# Patient Record
Sex: Male | Born: 1970 | Race: White | Hispanic: No | Marital: Married | Smoking: Never smoker
Health system: Southern US, Community
[De-identification: ages and names within clinical notes are randomized; demographics above are authoritative.]

## PROBLEM LIST (undated history)

## (undated) DIAGNOSIS — T7840XA Allergy, unspecified, initial encounter: Secondary | ICD-10-CM

## (undated) DIAGNOSIS — N201 Calculus of ureter: Secondary | ICD-10-CM

## (undated) DIAGNOSIS — E119 Type 2 diabetes mellitus without complications: Secondary | ICD-10-CM

## (undated) DIAGNOSIS — K5790 Diverticulosis of intestine, part unspecified, without perforation or abscess without bleeding: Secondary | ICD-10-CM

## (undated) DIAGNOSIS — Z9289 Personal history of other medical treatment: Secondary | ICD-10-CM

## (undated) DIAGNOSIS — I2699 Other pulmonary embolism without acute cor pulmonale: Secondary | ICD-10-CM

## (undated) DIAGNOSIS — E785 Hyperlipidemia, unspecified: Secondary | ICD-10-CM

## (undated) DIAGNOSIS — F419 Anxiety disorder, unspecified: Secondary | ICD-10-CM

## (undated) DIAGNOSIS — M1990 Unspecified osteoarthritis, unspecified site: Secondary | ICD-10-CM

## (undated) DIAGNOSIS — I82409 Acute embolism and thrombosis of unspecified deep veins of unspecified lower extremity: Secondary | ICD-10-CM

## (undated) DIAGNOSIS — M109 Gout, unspecified: Secondary | ICD-10-CM

## (undated) DIAGNOSIS — K760 Fatty (change of) liver, not elsewhere classified: Secondary | ICD-10-CM

## (undated) DIAGNOSIS — F32A Depression, unspecified: Secondary | ICD-10-CM

## (undated) DIAGNOSIS — G4733 Obstructive sleep apnea (adult) (pediatric): Secondary | ICD-10-CM

## (undated) DIAGNOSIS — M069 Rheumatoid arthritis, unspecified: Secondary | ICD-10-CM

## (undated) DIAGNOSIS — J45909 Unspecified asthma, uncomplicated: Secondary | ICD-10-CM

## (undated) DIAGNOSIS — F329 Major depressive disorder, single episode, unspecified: Secondary | ICD-10-CM

## (undated) DIAGNOSIS — I10 Essential (primary) hypertension: Secondary | ICD-10-CM

## (undated) HISTORY — DX: Major depressive disorder, single episode, unspecified: F32.9

## (undated) HISTORY — DX: Allergy, unspecified, initial encounter: T78.40XA

## (undated) HISTORY — DX: Other pulmonary embolism without acute cor pulmonale: I26.99

## (undated) HISTORY — DX: Diverticulosis of intestine, part unspecified, without perforation or abscess without bleeding: K57.90

## (undated) HISTORY — DX: Anxiety disorder, unspecified: F41.9

## (undated) HISTORY — DX: Acute embolism and thrombosis of unspecified deep veins of unspecified lower extremity: I82.409

## (undated) HISTORY — PX: KNEE SURGERY: SHX244

## (undated) HISTORY — DX: Hyperlipidemia, unspecified: E78.5

## (undated) HISTORY — DX: Essential (primary) hypertension: I10

## (undated) HISTORY — DX: Depression, unspecified: F32.A

## (undated) HISTORY — DX: Fatty (change of) liver, not elsewhere classified: K76.0

## (undated) HISTORY — DX: Obstructive sleep apnea (adult) (pediatric): G47.33

## (undated) HISTORY — DX: Type 2 diabetes mellitus without complications: E11.9

## (undated) HISTORY — DX: Personal history of other medical treatment: Z92.89

## (undated) HISTORY — PX: KNEE ARTHROSCOPY: SUR90

## (undated) HISTORY — DX: Unspecified osteoarthritis, unspecified site: M19.90

## (undated) HISTORY — DX: Unspecified asthma, uncomplicated: J45.909

## (undated) HISTORY — DX: Calculus of ureter: N20.1

## (undated) HISTORY — DX: Rheumatoid arthritis, unspecified: M06.9

## (undated) HISTORY — DX: Gout, unspecified: M10.9

---

## 2001-04-08 ENCOUNTER — Encounter: Payer: Self-pay | Admitting: Emergency Medicine

## 2001-04-08 ENCOUNTER — Emergency Department (HOSPITAL_COMMUNITY): Admission: EM | Admit: 2001-04-08 | Discharge: 2001-04-08 | Payer: Self-pay | Admitting: Emergency Medicine

## 2001-04-14 ENCOUNTER — Encounter: Payer: Self-pay | Admitting: Orthopedic Surgery

## 2001-04-14 ENCOUNTER — Encounter: Admission: RE | Admit: 2001-04-14 | Discharge: 2001-04-14 | Payer: Self-pay | Admitting: Orthopedic Surgery

## 2001-05-05 ENCOUNTER — Inpatient Hospital Stay (HOSPITAL_COMMUNITY): Admission: EM | Admit: 2001-05-05 | Discharge: 2001-05-10 | Payer: Self-pay | Admitting: Emergency Medicine

## 2001-05-05 ENCOUNTER — Encounter: Payer: Self-pay | Admitting: Orthopedic Surgery

## 2001-05-09 ENCOUNTER — Encounter: Payer: Self-pay | Admitting: Orthopedic Surgery

## 2001-05-28 ENCOUNTER — Inpatient Hospital Stay (HOSPITAL_COMMUNITY): Admission: RE | Admit: 2001-05-28 | Discharge: 2001-06-15 | Payer: Self-pay | Admitting: Orthopedic Surgery

## 2001-05-28 ENCOUNTER — Encounter: Payer: Self-pay | Admitting: Orthopedic Surgery

## 2001-05-29 ENCOUNTER — Encounter: Payer: Self-pay | Admitting: Orthopedic Surgery

## 2001-05-31 ENCOUNTER — Encounter: Payer: Self-pay | Admitting: Orthopedic Surgery

## 2001-06-03 ENCOUNTER — Encounter: Payer: Self-pay | Admitting: Orthopedic Surgery

## 2001-06-04 ENCOUNTER — Encounter: Payer: Self-pay | Admitting: Orthopedic Surgery

## 2001-06-07 ENCOUNTER — Encounter: Payer: Self-pay | Admitting: Orthopedic Surgery

## 2001-07-20 ENCOUNTER — Encounter: Admission: RE | Admit: 2001-07-20 | Discharge: 2001-07-20 | Payer: Self-pay | Admitting: Internal Medicine

## 2003-01-12 ENCOUNTER — Emergency Department (HOSPITAL_COMMUNITY): Admission: EM | Admit: 2003-01-12 | Discharge: 2003-01-12 | Payer: Self-pay | Admitting: Emergency Medicine

## 2003-01-12 ENCOUNTER — Encounter: Payer: Self-pay | Admitting: Emergency Medicine

## 2003-03-29 ENCOUNTER — Ambulatory Visit (HOSPITAL_COMMUNITY): Admission: RE | Admit: 2003-03-29 | Discharge: 2003-03-29 | Payer: Self-pay | Admitting: Specialist

## 2005-03-20 ENCOUNTER — Encounter: Admission: RE | Admit: 2005-03-20 | Discharge: 2005-03-20 | Payer: Self-pay | Admitting: Specialist

## 2005-06-20 ENCOUNTER — Ambulatory Visit (HOSPITAL_BASED_OUTPATIENT_CLINIC_OR_DEPARTMENT_OTHER): Admission: RE | Admit: 2005-06-20 | Discharge: 2005-06-20 | Payer: Self-pay | Admitting: Specialist

## 2005-06-20 ENCOUNTER — Ambulatory Visit (HOSPITAL_COMMUNITY): Admission: RE | Admit: 2005-06-20 | Discharge: 2005-06-20 | Payer: Self-pay | Admitting: Specialist

## 2008-06-26 ENCOUNTER — Encounter
Admission: RE | Admit: 2008-06-26 | Discharge: 2008-06-27 | Payer: Self-pay | Admitting: Physical Medicine & Rehabilitation

## 2008-06-27 ENCOUNTER — Ambulatory Visit: Payer: Self-pay | Admitting: Physical Medicine & Rehabilitation

## 2008-07-30 ENCOUNTER — Encounter: Admission: RE | Admit: 2008-07-30 | Discharge: 2008-07-30 | Payer: Self-pay | Admitting: Orthopaedic Surgery

## 2011-03-11 NOTE — Group Therapy Note (Signed)
CHIEF COMPLAINT:  Knee pain greater than back pain.   HISTORY:  Nathan Krause is a 40 year old male who had arthroscopic surgery of  the left knee in July 2002.  He had postoperative complications  including septic arthritis.  This resulted in additional procedures and  IV antibiotics.  He developed a DVT as well as a PE, was on Coumadin for  a time for the treatment of this.  Later underwent debridement of  fibrosis, a chondroplasty of the patellar femoral joint by another  surgeon on the left knee as well.  Subsequent to this, on June 20, 2005, he underwent a right knee medial meniscal tear debridement as well  as ACL debridement, but was felt to have a stable ACL and 75% of it  remained intact.  The patient has had cortisone injections which helped  him for 1-2 days, Synvisc injections that were not helpful.  He has been  recently fitted with a knee arthrosis.  He has had physical therapy 3-4  times postoperatively, but the last time was about 3 years ago.  He  still continues with some Achilles and hamstring stretching exercise.   His current pain is 3/10, average pain is 6, described as dull,  constant, increased with activity to 7/10 levels.  Sleep is poor,  partially related to the pain, partially related to what sounds like  worrying about the future.  Pain is worse with walking, sitting, and  standing.  Twisting does lead to some hip pain in his case.  His pain  improves with rest, heat, TENS unit on the knee and medications.  Relief  with meds is fair.  He walks 5 to 10 minutes at a time.  He climbs a few  steps, he drives, transverse alone.  He was last employed in January  2008.  He indicates problems with both prolonged sitting and prolonged  standing.  He completed his Oswestry disability questionnaire, had a  score of 48.  His standing tolerance is reported as 10 minutes.  His  sitting tolerance is 1 hour.  He needs assistance with meal prep,  household duties and shopping.  I  think he also indicates some problems  with dressing and this is mainly his shoes and socks.  He does not use  any adaptive equipment.  He has numbness and tingling, trouble walking,  spasms, depression, and anxiety.  He has had some weight gain, coughing,  and shortness of breath.   MEDICATIONS:  1. Simvastatin 40 mg a day.  2. Hydrochlorothiazide 25 mg a day.  3. Aspirin 81 mg a day.  4. Multivitamin 1 tablet per day.  5. Cinnamon 1 tablet per day.  6. Flaxseed oil 1300 mg per day.  7. Celexa 10 mg daily.  8. Albuterol inhaler p.r.n.   His pain medications include;  1. Ultram 50 mg b.i.d.  2. Mobic 7.5 t.i.d.  3. Vicodin 7.5 t.i.d.  4. Flexeril 10 mg t.i.d.  5. Glucosamine was started once a day.   In regards to the Vicodin, he thinks it helps his pain somewhat, but he  does not like to drive on this medicines because it makes him feel  funny.   SOCIAL HISTORY:  He is married.  He is applied for disability.  He last  worked in January 2008.  He tried going from a light duty type job to a  sedentary duty type job, but states that his knee pain increased with  prolonged sitting as well as prolonged  standing.   REVIEW OF SYSTEMS:  No bowel or bladder dysfunction.  No fevers.   VITAL SIGNS:  His blood pressure is 126/87, pulse 94, respirations 18,  and O2 sat 96% on room air.  GENERAL:  He is a well-developed, well-nourished male in no acute  distress.  Orientation x3.  Affect is bright and alert.  Gait is normal.  EXTREMITIES:  Without edema.  He has some multiple surgical scar, left  knee.  Deep tendon reflexes are normal.  Sensation is normal.  His upper  extremity strength is 5/5 in the deltoid, biceps, and grip.  Lower  extremity strength is 5/5 in the hip flexor, right knee extensor, right  knee flexor, right ankle dorsiflexor, and on the left side he has 4/5  strength in knee extensor, knee flexor, and 5 in the ankle dorsiflexor.  His muscle bulk appears equal  bilaterally.  He has decreased hip flexion  on the left side compared to the right side as well.  Internal and  external rotation is only minimally diminished.  There is mild  tenderness to palpation of greater trochanters.  His knees showed no  evidence of effusion.  He does have some medial joint line tenderness  compared to the lateral on the left side, but not on the right side.  His spine range of motion is 75% forward flexion and only 50% extension,  he feels like extension would cause him to fall if he got to go further.  His muscle tone in the upper and lower extremities is normal.  The range  of motion in the upper extremities are normal, lower extremities as  noted above.  Ankle range of motion is normal.   IMPRESSION:  1. Chronic postoperative pain, left knee.  He has knee contracture,      gets about 90 degrees of flexion.  This is also inhibiting      functionally in terms of getting on the shoes or socks on that      side.  2. Right knee pain, cannot really explain it just on the basis of his      meniscal pathology.  He may have some early arthritis as well, but      once again pain seems out of proportion to underlying knee      pathology.  He does have some intermittent right lower extremity      numbness as well as back pain, so I question whether the right knee      pain may be more radicular pain and therefore I am recommending MRI      or CT of the lumbar spine to further evaluate and may need to do      selective nerve root blocks as well.  If this is nonrevealing, may      consider EMG and CV of the lower extremities.  3. Further recommendations include occupational therapy for some      assisted devices such as reacher as well as a sock aid and elastic      shoelaces so that he can get the shoes and socks on without having      assistance.  4. Given his problems with side effects with narcotic analgesics in      terms of driving, we will see if there is other  pain relieving      medications available to him.  He has tried 50 mg b.i.d. of      tramadol, we try to double  up on that to 100 b.i.d. and alternate      with the Vicodin.  He may be all to get by with the tramadol during      the day and the Vicodin more in evening hours when he does not have      to drive.   He can continue the glucosamine, the Flexeril, and the Mobic.  He is  really taking the Mobic on rainy days and days when his knee pain is  flared up which I agree with rather than taking it chronically.   I do not think any other knee injections would be helpful.  TENS units  is certainly helpful.  estim unit within knee brace might be an option  for him.   He already does have a brace order and I think it is already fabricated.  I am not sure whether the bionicare could be applied to this current  brace.   Thank you for this interesting consultation, keep you appraised of the  situation.  We will check urine drug screen, if this shows no signs of  illicit-drug abuse or nondisclosed opiates would be listed in his  narcotic analgesic prescription and once again seek to minimize with  adjuvant treatments.      Erick Colace, M.D.  Electronically Signed     [facility]  D:  06/28/2008 10:00:16  T:  06/29/2008 00:30:27  Job #:  [id number]   cc:   Vanita Panda. Magnus Ivan, M.D.  Fax: [phone]   Dr. Chuck Hint

## 2011-03-14 NOTE — Op Note (Signed)
NAME:  Nathan Krause, Nathan Krause                           ACCOUNT NO.:  [id number]   MEDICAL RECORD NO.:  [id number]                   PATIENT TYPE:  AMB   LOCATION:  DAY                                  FACILITY:  [facility]   PHYSICIAN:  Jene Every, M.D.                 DATE OF BIRTH:  11-24-1970   DATE OF PROCEDURE:  03/29/2003  DATE OF DISCHARGE:                                 OPERATIVE REPORT   PREOPERATIVE DIAGNOSIS:  Degenerative joint disease, left knee,  arthrofibrosis, left knee, anterior cruciate ligament tear, chondromalacia.   POSTOPERATIVE DIAGNOSIS:  Arthrofibrosis of the left knee, flexion  contracture, grade 2 changes of patellofemoral joint, retracted patella,  anterior cruciate ligament tear.   PROCEDURE PERFORMED:  Exam under anesthesia, left knee arthroscopy,  debridement of fibrosis, chondroplasty of the patellofemoral joint.   ANESTHESIA:  General.   BRIEF HISTORY AND INDICATIONS:  This is a 40 year old male who has had an  extensive complex history of left knee.  He is status post patellar  dislocation treated by an outside physician with surgical reconstruction and  repair.  He developed arthrofibrosis, was casted, had DVT, PE, Staph  infection.  He was injured at work on March 18 and was seen by outside  physician.  MRI felt he had possible meniscus tear, he has an ACL tear.  The  patient was injected with cortisone which gave him temporary relief.  Arthroscopic evaluation was suggested in lieu of a complex reconstruction of  the knee.  The patient underwent diagnostic arthroscopy with debridement.  The risks and benefits were discussed including infection, no change in  symptoms, worsening in symptoms, need for total knee arthroplasty in the  future, etc.   SURGICAL TECHNIQUE:  The patient was placed in supine position, after  adequate general anesthesia and 1 gram of Kefzol, we examined the knee.  There was 20 degrees flexion contracture, he can flex to 110  degrees, no  instability to varus or valgus stressing.  No anterior or posterior drawer.  Next, a lateral patellar portal was fashioned with a #11 blade as was a  superomedial and parapatellar portal.  The cannula was atraumatically  placed.  Extensive arthrofibrosis was noted.  The patella was tracking  laterally, however, there was no glide or tilt associated with it.  I broke  up some of the scar tissue and arthrofibrosis with a blunt cannula.  I  placed a superolateral parapatellar portal, as well, introduced a cannula  through there, and broke up arthrofibrosis, as well.  Direct visualization,  a medial parapatellar portal was fashioned with a #11 blade after  localization with an 18 gauge needle.  There was extensive difficulty  in  visualization due to the extensive arthrofibrosis noted throughout the  joint.  The suprapatellar pouch, again, there was no tilt, glide, or  extensive chondromalacia of the patellofemoral joint, lateral tracking of  the  patella.  The ACL tear was felt to be near complete.  There were  extensive changes on the lateral femoral condyle and arthrofibrosis in the  infrapatellar region and the intercondylar notch region.  We introduced the  shaver and utilized it to debride the patellofemoral joint and the femoral  sulcus as well as the condyles.  His stiffness in the knee and his  arthrofibrosis precluded evaluation of the medial and lateral compartments  due to, again, the inability to visualize or insert instruments into the  joint lines given the epidural fibrosis.  I, however, did lavage both  compartments as well as the patellofemoral joint.  We placed a second portal  laterally above the other portals and lysed more adhesions laterally.  On  copious lavage of the knee there was copious portions of chondral material  in the effluent.  The gutters were not examined again due to arthrofibrosis.  It was felt that at this point in time, given the extensive  nature of his  arthrofibrosis, that the arthroscopic procedure at this point in time had  achieved its optimum result.  We lavaged the knee copiously and removed the  fluid and removed the arthroscopic equipment.  We closed the portals with 4-  0 nylon simple sutures.  Again, prior to this, we examined the ACL and it  appeared to be 75% torn, but again there was no increased instability noted  on exam.  We closed the portals with 4-0 nylon simple sutures.  0.25%  Marcaine with epinephrine was infiltrated into the joint.  The wounds were  dressed sterilely.  The patient was awakened without difficulty and  transported to the recovery room in satisfactory condition.  The patient  tolerated the procedure well, there were no complications.                                               Jene Every, M.D.    Cordelia Pen  D:  03/29/2003  T:  03/29/2003  Job:  [id number]

## 2011-03-14 NOTE — Op Note (Signed)
[place]  Patient:    Nathan Krause, Nathan Krause Visit Number: [id number] MRN: [id number]          Service Type: SUR Location: 5000 [id number] Attending Physician:  Teena Dunk Dictated by:   Sharlot Gowda., M.D. Proc. Date: 05/29/01 Admit Date:  05/28/2001 Discharge Date: 06/15/2001                             Operative Report  PREOPERATIVE DIAGNOSIS:  Possible septic arthritis and arthrofibrosis, left knee.  POSTOPERATIVE DIAGNOSIS:  Possible septic arthritis and arthrofibrosis, left knee.  PROCEDURES: 1. Arthroscopic lavage with synovectomy. 2. Manipulation.  SURGEON:  Marcie Mowers, Montez Hageman., M.D.  DESCRIPTION OF PROCEDURE:  The patient was arthroscoped through a superomedial, inferomedial, and inferolateral portal.  Deep cultures were sent.  There was bloody serous fluid that did not appear overtly purulent. There was some reactive synovitis.  The patient had had a previous medial meniscectomy.  This appeared to be in good shape.  The articular cartilage did not show any undue damage.  He was lavaged out with approximately 12,000 cc of fluid.  The knee was noted to be moderately stiff.  It was manipulated to a range of motion of approximately 5-10 to 100 degrees, 90-100 degrees compared to a preoperative range of motion of 20-50.  Hemovac drains were placed deeply, and he was taken to the recovery room in stable condition. Dictated by:   Sharlot Gowda., M.D. Attending Physician:  Teena Dunk DD:  08/13/01 TD:  08/14/01 Job: [id number]

## 2011-03-14 NOTE — Consult Note (Signed)
[place]  Patient:    Nathan Krause, Nathan Krause                        MRN: [id number] Proc. Date: 05/05/01 Adm. Date:  [id number] Attending:  Teena Dunk CC:         Pearla Dubonnet, M.D.  Sharlot Gowda., M.D.   Consultation Report  HISTORY OF PRESENT ILLNESS:  This is a 40 year old white male status post left knee surgery July 5 by Dr. Kristeen Miss.  The procedure was partially open for repair of medial meniscus injury and partial removal of patella.  The patient presents today with 24 hours of left medial thigh pain and the onset today of anterolateral pleuritic chest pain associated with fever of 102 without chills.  He may have had some shortness of breath.  He has had two days of a nonproductive cough.  He was seen by Dr. Madelon Lips today and was felt not to have a postoperative infection.  He was sent to the ER where a CT scan of the left leg shows a left superficial femoral vein DVT.  A CT scan of the chest was questionable for a left lower lobe pulmonary embolus.  There was lingular and left lower lobe atelectasis.  There is no family history of DVT or PE.  PAST MEDICAL HISTORY: 1. Asthma, mild, persistent.  Symptoms more severe in summer and winter and    uses albuterol MDI q.d. currently. 2. Hyperlipidemia.  PAST SURGICAL HISTORY: 1. Right knee arthroscopy. 2. Left knee surgery April 30, 2001 by Dr. Madelon Lips.  ALLERGIES:  LIPITOR caused stomach upset, PERCOCET nausea.  MEDICATIONS:  Albuterol MDI q.i.d. p.r.n.  FAMILY HISTORY:  Father died age 42 of MI.  Mother age 53 in good health.  No siblings.  SOCIAL HISTORY:  Married.  He is a nonsmoker.  Rare alcohol.  He is International aid/development worker at [employer].  REVIEW OF SYSTEMS:  Otherwise negative.  PHYSICAL EXAMINATION  GENERAL:  He appears comfortable and in no distress.  VITAL SIGNS:  Pending.  Oxygen saturation is 92-95% on room air.  HEENT:  Oral cavity and pharynx is  clear.  NECK:  No elevation of jugular venous pressure.  LUNGS:  Mild decreased breath sounds bilaterally without wheezes or rales.  HEART:  Regular rate and rhythm without murmur, gallop, or rub.  ABDOMEN:  Soft, nontender.  Normal bowel sounds.  No mass or hepatosplenomegaly.  EXTREMITIES:  Tenderness to palpation in the left medial thigh, but no calf edema or tenderness.  LABORATORIES:  Sodium 133, potassium 3.7, chloride 104, bicarbonate 26, BUN 11, creatinine 1.1, glucose 137, alkaline phosphatase 131, total bilirubin 1.5.  CBC:  Hemoglobin 15.0, platelet count 337,000, WBC 10.2.  EKG shows sinus tachycardia with a rate of 109 and nonspecific T-wave changes. CT scan of the leg shows a left superficial femoral vein deep venous thrombosis.  CT scan of the chest shows a questionable left lung pulmonary embolus and left lower lobe and left lingular atelectasis.  ASSESSMENT: 1. Left thigh deep venous thrombosis/probable left lung pulmonary embolus in a    setting of a recent left knee surgery. 2. Asthma, mild, persistent.  He does not have an acute exacerbation.  RECOMMENDATION:  IV heparin per pharmacy.  Coumadin per pharmacy.  Albuterol p.r.n.  Oxygen. DD:  05/05/01 TD:  05/06/01 Job: [id number]

## 2011-03-14 NOTE — Op Note (Signed)
[place]  Patient:    Nathan Krause, Nathan Krause Visit Number: [id number] MRN: [id number]          Service Type: SUR Location: 5000 [id number] Attending Physician:  Teena Dunk Dictated by:   Sharlot Gowda., M.D. Proc. Date: 07/21/01 Admit Date:  05/28/2001 Discharge Date: 06/15/2001                             Operative Report  PREOPERATIVE DIAGNOSIS:  Probable pyarthrosis of left knee.  POSTOPERATIVE DIAGNOSIS:  Probable pyarthrosis of left knee.  OPERATION PERFORMED: 1. Arthroscopic synovectomy with 2. Arthroscopic lavage. 3. Manipulation of left knee under general anesthesia.  SURGEON:  Sharlot Gowda., M.D.  ANESTHESIA:  General.  DESCRIPTION OF PROCEDURE:  The patient had one culture drawn in the office which was apparently growing Staph and he had previously had a history of DVT. For these reasons, he was scheduled for arthroscopic lavage.  He was approached through a superomedial, inferomedial and inferolateral portal. Systematic inspection of the knee showed there to be a bloody exudate within the joint.  New cultures were sent from the joint.  There was no significant damage to the articular cartilage.  Previous meniscectomy site looked good. There was moderate reactive synovitis which was debrided followed by copious irrigation with approximately 1200 cc of fluid.  Hemovac drains were placed deeply into the joint and sutured in.  In addition, the knee which was stiff was manipulated to probably 5 to 95 from a preoperative range of motion from 20 to 40 degrees only.  He was placed in lightly compressive sterile dressings and a knee immobilizer and taken to recovery room in stable condition. Dictated by:   Sharlot Gowda., M.D. Attending Physician:  Teena Dunk DD:  07/21/01 TD:  07/21/01 Job: [id number]

## 2011-03-14 NOTE — Discharge Summary (Signed)
[place]  Patient:    Nathan Krause, Nathan Krause                        MRN: [id number] Adm. Date:  [id number] Disc. Date: [id number] Attending:  Teena Dunk Dictator:   Arnoldo Morale, P.A.C. CC:         Pearla Dubonnet, M.D.   Discharge Summary  ADMISSION DIAGNOSES: 1. Asthma. 2. Pulmonary emboli versus pneumonia.  DISCHARGE DIAGNOSES: 1. Asthma. 2. Left thigh deep venous thrombosis. 3. Pulmonary emboli. 4. Constipation.  SURGICAL PROCEDURE:  None.  CONSULTS: 1. Internal medicine consult obtained by Pearla Dubonnet, M.D.,    May 05, 2001. 2. Pharmacy consult for Lovenox, heparin, and Coumadin therapy May 05, 2001. 3. Case management May 06, 2001. 4. Physical therapy consult May 07, 2001.  HISTORY OF PRESENT ILLNESS:   This 40 year old white male patient had undergone a left knee arthroscopy with lateral release by Dr. Madelon Lips on April 30, 2001.  This was done as an outpatient. The patient started to develop severe nausea and vomiting after surgery and then complained of some left lower chest wall discomfort and an elevated temperature of 102.  He was seen in the office and was noted to have a swollen left knee and tender thigh and significant shortness of breath and decreased lung sounds in his bases.  He was admitted to rule out a DVT and pulmonary emboli versus pneumonia.  HOSPITAL COURSE:  Mr. Clock was admitted and seen immediately by Dr. Robley Fries.  CT of the left leg at that time showed a left superficial femoral vein deep venous thrombosis and chest CT at that time showed possible left lower lobe pulmonary emboli and lingular atelectasis. He was started on IV heparin per pharmacy protocol and also Coumadin to anticoagulate him for the clots and albuterol and oxygen to help with his respiratory status. Dr. Kevan Ny followed him closely throughout his hospitalization.  On hospital day #2, May 06, 2001, he reported  his thigh pain was improved. Blood cultures and knee aspirates were still pending for infection.  His Coumadin was not therapeutic at that time.  He was continued on IV heparin. He was still basically on bed rest at that time. T-max was 101.6, vitals were stable.  Hemoglobin was 14.5, hematocrit 41.9.  He continued to remain stable from a medical standpoint over the next several days.  His PT and INR continued to rise slowly.  He was able to start some limited physical therapy and range of motion of his knee.  Hemoglobin and hematocrit remained stable.  He had no real new complaints other than some mild constipation which was treated with medications.  Finally, on May 10, 2001, his INR was 2.1 and he was believed ready for discharge home. He was discharged on that day.  DISCHARGE INSTRUCTIONS: 1. Diet:  He is to resume his prehospitalization diet. 2. Medications:    a. Advair 250/50 one puff inhaled b.i.d.    b. Coumadin 5 mg tablets two tablets p.o. q.d. with adjustments to be       made by Dr. Robley Fries.    c. Ventolin inhaler, albuterol MDI one to two puffs b.i.d. p.r.n.       shortness of breath.    d. He is to resume his prehospitalization medications for pain. 3. Activity:  He is to be out of bed weightbearing as tolerated on the    left leg  with use of crutches. 4. Wound care:  He is to keep his left knee wound clean and dry and to    notify Dr. Madelon Lips of temperature greater than 101.5, chills, pain    unrelieved by pain medications or foul smelling drainage from the wound. 5. Follow-up:  He needs to follow up with Dr. Kevan Ny in two days after    discharge for a repeat PT and INR.  He needs follow-up with Dr. Madelon Lips    on Wednesday, May 12, 2001, in our office and needs to call [phone]    to set up that appointment.  He stated good understanding of these instructions and was subsequently discharged home.  LABORATORY DATA:  Chest x-ray on May 05, 2001, showed mild to  moderate left basilar atelectasis or pneumonia.  Chest x-ray on May 09, 2001, showed worsening left lower lobe consolidation with minimal patchy posterior right lower lobe infiltrate or atelectasis.  On May 05, 2001, chest and lower extremity CT showed suggestion of a small embolus in the lateral segment of the left lower lobe pulmonary artery with mild atelectasis in the left lower lobe and lingula and minimal left pleural fluid. There was also a left superficial deep venous thrombus noted with associated perivascular edema and a moderate size left knee joint effusion with peripheral enhancement.  On May 05, 2001, pH was 7.482, pCO2 30.5, pO2 69.6.  On May 06, 2001, white count 11.2, hemoglobin 14.5, hematocrit 41.9.  On May 07, 2001, white count 8, hemoglobin 12.9, hematocrit 37.  On May 08, 2001, hemoglobin 12.4, hematocrit 36. On May 09, 2001, hemoglobin 11.5, hematocrit 32.9. On May 10, 2001, hemoglobin 12.3, hematocrit 35.3.  On May 06, 2001, PT 15.1, INR 1.3, and PTT 80.  On May 07, 2001, PT 16.5, INR 1.5, PTT 122.  May 08, 2001, PT 19, INR 1.9, PTT 126.  On May 09, 2001, PT 18.5, INR 1.8, PTT 166.  On May 10, 2001, PT 20.2, INR 2.1, and PTT 103.  On May 05, 2001, sodium 133, potassium 3.7, glucose 137, albumin 3.3, alkaline phosphatase 131, and total bilirubin 1.5. Urinalysis on May 05, 2001, showed clear amber urine, specific gravity greater than 1.040, small bilirubin, 4 mg/dL of urobilinogen.  Blood culture showed no growth after five days.  Urine culture showed 20,000 colonies/mL of Staph aureus which was sensitive to all antibiotics.  All other laboratory studies were within normal limits. DD:  05/27/01 TD:  05/27/01 Job: [id number]

## 2011-03-14 NOTE — Op Note (Signed)
NAMEQUANTA, ROHER                 ACCOUNT NO.:  [id number]   MEDICAL RECORD NO.:  [id number]          PATIENT TYPE:  AMB   LOCATION:  [facility]                         FACILITY:  [facility]   PHYSICIAN:  Jene Every, M.D.    DATE OF BIRTH:  1970/11/07   DATE OF PROCEDURE:  06/20/2005  DATE OF DISCHARGE:                                 OPERATIVE REPORT   PREOPERATIVE DIAGNOSES:  Right knee medial meniscus tear, anterior cruciate  ligament tear.   POSTOPERATIVE DIAGNOSES:  Right knee medial meniscus tear, anterior cruciate  ligament tear, lateral meniscus tear.   PROCEDURE:  Right knee arthroscopy, partial medial meniscectomy, partial  lateral meniscectomy, debridement of ACL.   ANESTHESIA:  General.   ASSISTANT:  None.   BRIEF HISTORY:  A 40 year old with right knee pain. MRI indicating possible  tear of the ACL and medial meniscus tear. Operative intervention was  indicated for diagnosis and treatment, address symptoms related to pain and  instability. The risks and benefits were discussed including bleeding,  infection, injury to neurovascular structures, DVT, PE, ___etc_______,  complications, no change in symptoms, worsening symptoms, etc.   TECHNIQUE:  The patient in supine position and after the induction of  adequate general anesthesia and 1 gram of Kefzol, the right lower extremity  was prepped and draped in the usual sterile fashion. A superomedial  parapatellar portal and lateral parapatellar portal was fashioned in the  usual manner. Ingress cannula atraumatically placed, irrigant was utilized  to insufflate the joint. A camera was then inserted laterally. Then under  direct visualization, a medial parapatellar portal was fashioned with a #11  blade after localization with an 18 gauge needle sparing the medial  meniscus.   Inspection of the suprapatellar pouch revealed normal patellofemoral  tracking and some mild chondral changes of the patellofemoral joint. The  medial  compartment revealed a tear of the anterior horn of the medial  meniscus displaced, also tear of the posterior third of the medial meniscus  in the inner third of it. This was complex. A basket was introduced and  utilized to perform a partial medial meniscectomy to a stable base  posteriorly and anteriorly. This was further contoured with a 4.2 Kuda  shaver. There was some tears of the ACL. We debrided that, approximately 75%  of the ACL intact. We performed an anterior drawer with that, it was intact.  We had performed under anesthesia an anterior drawer and a pivot shift that  was not unstable.   The lateral meniscus revealed a small tear in the anterolateral edge and  this was shaved with a shaver to the stable base. The gutters were  unremarkable, the knee was copiously lavaged, I probed all menisci. There  was no residual tear displaced amendable to surgical intervention.  The  chondral surfaces of the condyle showed some minor grade 3 changes, no  evidence of significant arthritis.   Next, the knee was copiously lavaged, all instrumentation was removed,  portals were closed with 4-0 nylon simple sutures. Marcaine 0.25% with  epinephrine was infiltrated in the joint. The wound was  dressed sterilely,  he was awoken without difficulty and transported to the recovery room in  satisfactory condition.   The patient tolerated the procedure well with no complications.      Jene Every, M.D.  Electronically Signed     JB/MEDQ  D:  06/20/2005  T:  06/20/2005  Job:  [id number]

## 2011-03-14 NOTE — Op Note (Signed)
[place]  Patient:    Nathan Krause, Nathan Krause                        MRN: [id number] Proc. Date: 06/04/01 Adm. Date:  [id number] Attending:  Teena Dunk                           Operative Report  PREOPERATIVE DIAGNOSES: 1. 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 2. 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  POSTOPERATIVE DIAGNOSES: 1. 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 2. 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  OPERATION: 1. Arthroscopic lavage, left knee. 2. Fasciotomy of left thigh with exploration.  SURGEON:  Sharlot Gowda., M.D.  ASSISTANT:  Jamelle Rushing, P.A.  DESCRIPTION OF PROCEDURE:  The patient was sterilely prepped and draped with complete access to the thigh and hip.  He had an indurated swollen thigh, history of DVT, questionable pyomyositis of the leg based on an MRI.  He is having significant pain and stiffness of his knee with recurrent fever.  He was relavaged with about 12,000 cc of fluid, and really it was more remarkable for bloody clots than anything that I would say was grossly purulent but in fact this was cultured.  Again, with the questionable history of possible proximal infection several aspirations were made in the thigh with production of no purulent material, specifically the patient had the anterior medial incision for the previous medial plication explored.  It was seen to be mainly intact with no evidence of purulent.  In point of fact, due to the significant amount of swelling and pain in the thigh and question of infected muscle, a limited fasciotomy of the thigh was carried out.  The muscle on the lateral aspect an anterior aspect of the thigh posterior was visualized.  There was no necrotic or infected muscle.  It also appeared completely viable.  There was a lot of subcutaneous edema.  In view of this fact, this fascial area was loosely closed  with Vicryl and skin clips.  It should be noted that clean instruments and gloves were used on this portion of the case although no gross purulence was encountered throughout the case.  Additionally, the medial aspect of the thigh was indurated.  It was aspirated with a needle when no purulence was seen, and in view of the fact that the lateral aspiration was negative and the medial aspiration was negative it was deemed not to be necessary to open the medial aspect of the thigh.  Irrigation of the joint was carried out.  Closure over two Hemovac drains in the joint with a small Jackson-Pratt drain over the medial plication site which was reclosed.  Point of fact, despite the fact that the patient had been manipulated just approximately six days ago, he had reaccumulation of a significant thigh contracture probably I would think based on the presence of early myositis. He was gently and gradually manipulated to where he obtained 90 degrees and in point of fact due to the significant problems with motion it was elected to place a very lightly compressive dressing with no plaster on the posterior aspect of the leg and keep him flexed at 90 degrees and apply plaster splints medial and laterally to potentially at least enable their to be a chance of more flexion by being obtained postoperatively.  He was taken to the recovery room  in stable condition. DD:  06/04/01 TD:  06/05/01 Job: [id number]

## 2011-03-14 NOTE — Discharge Summary (Signed)
[place]. [facility]  Patient:    Nathan Krause, Nathan Krause Visit Number: [id number] MRN: [id number]          Service Type: SUR Location: [id number] Attending Physician:  Teena Dunk Dictated by:   Arnoldo Morale, P.A. Adm. Date:  05/28/2001 Disc. Date: 06/15/2001   CC:         Rockey Situ. Flavia Shipper., M.D.  Pearla Dubonnet, M.D.   Discharge Summary  ADMISSION DIAGNOSES: 1. Left knee pyarthrosis, rule out osteomyelitis. 2. Left thigh deep venous thrombosis. 3. Pulmonary emboli. 4. Asthma.  DISCHARGE DIAGNOSES: 1. Methicillin sensitive Staph aureus infection, left knee. 2. Myositis ______ left thigh. 3. Left thigh deep venous thrombosis. 4. Pulmonary emboli. 5. Asthma. 6. Mild depression. 7. Constipation that resolved.  PROCEDURE: 1. Left knee arthroscopic lavage with synovectomy done by Dr. Haze Boyden.    [place] on May 29, 2001. 2. Arthroscopic lavage of the left knee with a left thigh exploration and    fasciotomy by Dr. Marcie Mowers assisted by Oneida Alar, P.A.-C. on June 04, 2001.  COMPLICATIONS:  There were no complications with either procedure.  CONSULTS: 1. Infectious disease consult by Dr. Burnice Logan on May 28, 2001. 2. Internal medicine consult by Dr. Johnella Moloney on May 28, 2001. 3. Pharmacy consult for Lovenox therapy May 30, 2001. 4. Case management and physical therapy consult on June 02, 2001. 5. Rehabilitation medicine consult on June 03, 2001. 6. Nutrition consult June 07, 2001.  HISTORY OF PRESENT ILLNESS:  This 40 year old white male patient of Dr. Encarnacion Slates is status post a left knee arthroscopy with partial medial meniscectomy and open patellar realignment on April 30, 2001 which was complicated with a left femoral vein deep venous thrombosis and pulmonary emboli diagnosed on May 05, 2001.  He was hospitalized for approximately one week at that time and then discharged home.  He had been  doing well until about nine days prior to admission when he complained of increasing left knee pain.  There was no known injury.  The pain was diffuse about the knee and radiating up and down the leg.  The knee was aspirated in the office several days prior to admission and cultures of the fluid grew Gram positive cocci which are coag positive.  He is being admitted at this time for an arthroscopic lavage and an MRI or CT of the left knee to rule out osteomyelitis.  HOSPITAL COURSE:  Nathan Krause is admitted on August 2 with a Tmax of 100.3 but his vitals were stable.  He was sent for an MRI or a CT of the knee and infectious disease and internal medicine was consulted.  He was started on Ancef IV q.8h. 2 g and blood cultures were obtained.  Dr. Roxan Hockey followed him throughout his hospital course.  Coumadin was held at that time and he was given a dose of vitamin K to lower his PT/INR.  Patient could not tolerate the MRI or CT scan so that was canceled and a regular x-ray was done at that time which just showed findings consistent with an erosive synovitis or septic arthritis.  He was n.p.o. for surgery the next day.  He underwent surgery on May 29, 2001 and tolerated that well.  No immediate postoperative complications.  He was restarted on Lovenox and Coumadin and started in CPM.  On August 4 Tmax was 101.5 and he was continued just on the CPM.  He did have  difficulty with that because of the pain.  This continued over the next several days.  His Tmax on August 5 was 102.9 and he continued to make very slow progress with therapy.  On June 01, 2001 MRI taken showed extensive myositis of the left thigh with fluid in that area and also the clot in the deep femoral vein.  He had several episodes of emesis at that time and has had no bowel movements so laxatives were given to help resolve a possible ileus. His Tmax at that time was 102.  On June 02, 2001 his pain had decreased.  He was  complaining of some left thigh pain, however, and the thigh was warm and edematous.  He continued to make very slow progress with therapy because of pain and spasms in his leg. His constipation had resolved with the use of laxatives the day before.  His Tmax at that time was 101.2.  On June 03, 2001 the nausea and vomiting had markedly decreased.  He continued to complain of significant pain in his left thigh and his hemoglobin was noted at that time to be 7.4 with a hematocrit of 21.1.  This was believed to be due to a hemarthrosis and his Lovenox and Coumadin were stopped.  He was to be transfused with 2 units of packed red blood cells.  On transfusion of the first unit he did spike a temperature of 102.3 that did not resolve with Tylenol and that unit was stopped.  Transfusion reaction protocol was followed and the workup for that was negative.  He did eventually receive 2 units of blood later that day and he tolerated those without too much difficulty.  His Tmax during that transfusion was 101.5.  Because of the probable hemarthrosis in the knee he was scheduled for arthroscopic lavage of the knee and exploration of the left thigh for the next day.  Also, a CT of his abdomen and pelvis were ordered to rule out a GI source of his bleeding.  This was ordered for the 9th.  On June 04, 2001 he tolerated surgery well.  He was placed in a 90 degree splint in the left knee postoperatively with no range of motion until the splint would be removed.  He refused CT of the abdomen and pelvis at that time and continued to refuse it over the next several days so that was eventually canceled on August 13.  On June 05, 2001 it was believed safe to restart his Lovenox but it was started at a low dose 30 mg subcutaneous b.i.d.  Tmax at that time was 101.5. Pain was fairly well controlled with PCA at that time.  Urine culture from July 10 had grown out methicillin sensitive Staph aureus and this  was  consistent with the left knee cultures that also grew that on July 29 and August 3.  He was continued on Ancef 2 g IV q.8h.  On June 06, 2001 his hemoglobin was stable.  His drains were discontinued. Tmax was 101.2.  He did have a PICC line placed for antibiotic therapy on August 12 and the splint from the left leg was discontinued.  Tmax at that time was 100.3.  On August 13 his fever curve had gone down markedly with a Tmax of only 97.5. His CPM was restarted and pain was much improved.  His hemoglobin at that time was 8.9 with hematocrit of 25.5 and he was started on iron for his anemia.  He continued to remain  afebrile with his vital signs stable over the next several days.  He was complaining of some symptoms of depression on August 14 and Dr. Kevan Ny started him on Celexa at that time.  He was also started on aggressive physical therapy.  His Coumadin was able to be restarted on August 15 and he did have one episode of nausea when he had significant pain in his knee.  He was having good bowel movements and had no sign of an ileus so this was just monitored.  On August 16 he continued to do well with therapy, but was making slow progress.  His Celexa was increased to 20 mg q.d.  Plans were begun for probable discharge home in the next several days.  He remained afebrile with his vital signs stable.  On June 15, 2001 he is ready for discharge home.  His Tmax in the last 24 hours has been 99 degrees Fahrenheit, pulse 92, respirations 18, and blood pressure 110/52.  He has tolerated CPM from 0-65 degrees at this time.  He is working with physical therapy and is moving better.  Left knee arthroscopy portals are well healed and approximated and sutures removed.  He has an approximately 1-1.5 inch open wound on the anterior surface of his knee that has pink granulation tissue and no active drainage.  He is getting normal saline wet to dry dressing changes b.i.d.  He will be  discharged home today.  DIET:  He can resume his pre hospitalization diet.  DISCHARGE MEDICATIONS: 1. Coumadin 10 mg p.o. q.d. 2. Lovenox 120 mg subcutaneous q.12h. until Coumadin therapeutic. 3. Celexa 20 mg one tablet p.o. q.d. #30 with no refills. 4. Ancef 2 g IV q.8h. for two weeks.  After that he needs to be on Keflex 500    mg p.o. b.i.d. for an additional month. 5. Robaxin 500 mg one to two tablets p.o. q.6h. p.r.n. spasm #50 with no    refill. 6. Ferrous sulfate 325 mg one tablet p.o. b.i.d. 7. Advair 250/50 diskus one diskus inhaled b.i.d. 8. Ventolin 90 mcg inhaler two puffs inhaled q.i.d. 9. Mepergan Fortis one to two tablets p.o. q.4h. p.r.n. pain.  ACTIVITY:  Can be out of bed weightbearing as tolerated on the left knee with the use of the walker or crutches.  He needs aggressive physical therapy, range of motion and strengthening to that left knee every day by home health physical therapy.  He needs to be in the CPM 0-110 degrees as tolerated at least 10-12 hours a day.  LABORATORIES:  He needs q.d. PT and INR obtained at this time with the results called to Dr. Johnella Moloney at [phone].  Dr. Kevan Ny will be adjusting the frequency of the laboratory draws and also the Coumadin dosing for the patient.  He also needs a CBC with a differential and a CMET on June 22, 2001 with those results faxed to Dr. Burnice Logan at [phone].  WOUND CARE:  He needs normal saline wet to dry dressing changes to the left knee wound b.i.d.  Home health nurse to teach his wife on how to do this dressing.  He has arranged for a home health RN and PT for [facility].  FOLLOW-UP: 1. He needs followup with Dr. Madelon Lips in our office on Thursday, June 17, 2001 and he needs to call [phone] to set up that appointment. 2. He needs followup in the outpatient infectious disease clinic with either    Dr. Orvan Falconer,  Dr. Roxan Hockey, Dr. Georgetta Haber, or Dr. Maurice March in approximately two    weeks and  he needs to call [phone] to set up that appointment. 3. He needs to call Dr. Johnella Moloney office to find out when Dr. Kevan Ny would    like to see him in followup.  DISCHARGE INSTRUCTIONS:  Please notify Dr. Madelon Lips of temperature greater than 101.5, chills, pain unrelieved by pain medications, or foul smelling drainage from the wound.  RADIOLOGIC FINDINGS:  X-ray taken of the left knee on May 29, 2001 showed a large soft tissue density in the suprapatellar area, probably a large joined effusion with some air, and a surgical drain in place extending laterally.  No definite bony destruction is seen.  X-ray of the left knee taken on August 2 showed findings consistent with erosive synovitis or septic arthritis.  MRI of the left leg without contrast on May 31, 2001 showed extensive infection involving the entire visualized portions of the vastus intermedius and medialis muscles.  There is also multiple, small, intramuscular abscesses as well as evidence of septic arthritis of the left knee.  There is a lateral patellar subluxation which is marked.  Subcutaneous fluid collections are superficial to the proximal vastus lateralis muscle and the proximal hamstrings, possibly could be septic fluid as well.  There is no normal segments of the vastus intermedius and vastus medialis muscles and there is air seen in the distal thigh anteriorly, probably in the suprapatellar recess. There is also some hemorrhage in the distal vastus intermedius muscle.  Chest x-ray done on June 03, 2001 showed improve aeration of the left lower lobe with some residual plate like atelectasis or scarring.  Left knee view on June 04, 2001 showed knee drains grossly well positioned and looping about within the joint.  On June 07, 2001 x-ray taken to check central line placement showed the single lumen PICC line in the right basilic vein with tip lies in the SVC.  CT of the pelvis and abdomen without contrast on  June 07, 2001 showed inflammatory changes noted in subcutaneous fat along the left side of the abdomen, but no acute process.  CT of the pelvis showed inflammatory changes in the subcutaneous fat along the left side, otherwise negative.   LABORATORIES:  May 28, 2001:  Hemoglobin 12.5, hematocrit 36.  August 8: Hemoglobin 7.4-7.9, hematocrit 21.1 and 22.9.  August 9:  Hemoglobin 9.6, hematocrit 27.6, platelets 445,000.  August 10:  Hemoglobin 9.2, hematocrit 26.8, platelets 425,000.  August 11:  Hemoglobin 9, hematocrit 26.1, platelets 452,000.  August 12:  Hemoglobin 8.8, hematocrit 25.5, platelets 487,000. August 13:  Hemoglobin 8.9, hematocrit 25.5, platelets 504,000.  August 14: Hemoglobin 10, hematocrit 29.2, platelets 623,000.  August 15:  Hemoglobin 9.3, hematocrit 27.3, platelets 582,000.  August 16:  Hemoglobin 9.8, hematocrit 28.2, platelets 616,000.  August 17:  White count 7.9, hemoglobin 9.8, hematocrit 28.6, platelets 572,000.  May 28, 2001:  PT 29.8, INR 4, PTT 80.  August 3:  PT 19.1, INR 1.9, PTT 44.  June 14, 2001:  PT 15.4, INR 1.3. June 15, 2001:  PT 15.4, INR 1.3.  August 2:  Albumin 2.5.  August 3: Albumin 2.3.  August 8:  Potassium 3.3, BUN 4, calcium 8.2.  August 5: Albumin 1.9.  August 9:  Glucose 118, BUN 4, creatinine 0.9, calcium 8.1. August 10:  Sodium 135, potassium 4.2, chloride 99, CO2 29, glucose 144, BUN 5, creatinine 0.9, calcium 7.8.  August 2:  ALT 77, alkaline phosphatase  189, total bilirubin 1.3.  August 3:  ALT 70, ALP 182, total bilirubin 2.2, D bilirubin 1.2.  August 5:  ALT 39, ALP 145, total bilirubin 1.9, D bilirubin 1.1, I bilirubin 0.8.  August 13:  Iron 36 mcg/dl, TIBC 841 mcg/dl, percent saturation 32%.  August 3:  All hepatitis studies were negative.  Urinalysis on August 2 showed moderate hemoglobin, but all other indices normal.  On August 3 had small amount of hemoglobin, large bilirubin, positive ICTO, 4 mg/dl urobilinogen,  positive nitrates, small leukocyte esterase, few epithelialis, 7-10 white cells, 3-6 red cells, few bacteria.  Repeat UA on August 8 showed small bilirubin, 2 urobilinogen, rare epithelialis, and 0-2 white cells.  Blood cultures done on August 2 and August 8 showed no growth after five days.  Left knee culture done on August 3 showed few Staph aureus which was sensitive to all antibiotics except penicillin and erythromycin. All other laboratory studies were within normal limits. Dictated by:   Arnoldo Morale, P.A. Attending Physician:  Teena Dunk DD:  06/15/01 TD:  06/15/01 Job: [id number]

## 2012-05-04 DIAGNOSIS — H9191 Unspecified hearing loss, right ear: Secondary | ICD-10-CM | POA: Insufficient documentation

## 2012-05-04 DIAGNOSIS — M1990 Unspecified osteoarthritis, unspecified site: Secondary | ICD-10-CM | POA: Insufficient documentation

## 2012-05-04 DIAGNOSIS — Z9109 Other allergy status, other than to drugs and biological substances: Secondary | ICD-10-CM | POA: Insufficient documentation

## 2012-05-04 DIAGNOSIS — F329 Major depressive disorder, single episode, unspecified: Secondary | ICD-10-CM | POA: Insufficient documentation

## 2012-05-04 DIAGNOSIS — Z9889 Other specified postprocedural states: Secondary | ICD-10-CM | POA: Insufficient documentation

## 2012-05-04 DIAGNOSIS — M069 Rheumatoid arthritis, unspecified: Secondary | ICD-10-CM | POA: Insufficient documentation

## 2012-05-04 DIAGNOSIS — F32A Depression, unspecified: Secondary | ICD-10-CM | POA: Insufficient documentation

## 2012-05-04 DIAGNOSIS — Z86718 Personal history of other venous thrombosis and embolism: Secondary | ICD-10-CM | POA: Insufficient documentation

## 2013-08-08 DIAGNOSIS — J45909 Unspecified asthma, uncomplicated: Secondary | ICD-10-CM | POA: Insufficient documentation

## 2015-08-03 ENCOUNTER — Other Ambulatory Visit: Payer: Self-pay | Admitting: Primary Care

## 2015-08-03 ENCOUNTER — Encounter: Payer: Self-pay | Admitting: Primary Care

## 2015-08-03 ENCOUNTER — Ambulatory Visit (INDEPENDENT_AMBULATORY_CARE_PROVIDER_SITE_OTHER): Payer: Medicare HMO | Admitting: Primary Care

## 2015-08-03 VITALS — BP 116/78 | HR 75 | Temp 97.6°F | Ht 78.0 in | Wt 310.0 lb

## 2015-08-03 DIAGNOSIS — M109 Gout, unspecified: Secondary | ICD-10-CM | POA: Insufficient documentation

## 2015-08-03 DIAGNOSIS — I10 Essential (primary) hypertension: Secondary | ICD-10-CM

## 2015-08-03 DIAGNOSIS — E119 Type 2 diabetes mellitus without complications: Secondary | ICD-10-CM

## 2015-08-03 DIAGNOSIS — F418 Other specified anxiety disorders: Secondary | ICD-10-CM | POA: Diagnosis not present

## 2015-08-03 DIAGNOSIS — E785 Hyperlipidemia, unspecified: Secondary | ICD-10-CM | POA: Diagnosis not present

## 2015-08-03 DIAGNOSIS — IMO0001 Reserved for inherently not codable concepts without codable children: Secondary | ICD-10-CM

## 2015-08-03 DIAGNOSIS — M1A9XX0 Chronic gout, unspecified, without tophus (tophi): Secondary | ICD-10-CM

## 2015-08-03 DIAGNOSIS — F419 Anxiety disorder, unspecified: Secondary | ICD-10-CM

## 2015-08-03 DIAGNOSIS — E1165 Type 2 diabetes mellitus with hyperglycemia: Principal | ICD-10-CM

## 2015-08-03 DIAGNOSIS — F329 Major depressive disorder, single episode, unspecified: Secondary | ICD-10-CM

## 2015-08-03 LAB — HEMOGLOBIN A1C: Hgb A1c MFr Bld: 7.4 % — ABNORMAL HIGH (ref 4.6–6.5)

## 2015-08-03 MED ORDER — METFORMIN HCL 1000 MG PO TABS: 1000.0000 mg | ORAL_TABLET | Freq: Two times a day (BID) | ORAL | Status: DC

## 2015-08-03 MED ORDER — CITALOPRAM HYDROBROMIDE 20 MG PO TABS: 20.0000 mg | ORAL_TABLET | Freq: Every day | ORAL | Status: DC

## 2015-08-03 NOTE — Assessment & Plan Note (Signed)
Diagnosed in 2009.  Managed on Metformin 500 mg BID. Last A1C per patient was 6 months ago at 7.9. Intermittent neuropathy. Will obtain A1C today and adjust meds if needed. Will do full work up with upcoming physical.

## 2015-08-03 NOTE — Assessment & Plan Note (Signed)
Diagnosed in 2002. Managed on Celexa 20 mg daily. Feels well managed on this dose. Refills provided today.

## 2015-08-03 NOTE — Assessment & Plan Note (Signed)
Longstanding history of. Currently managed on 300 mg of allopurinol daily. No flare up in years. Will consider decreasing and d/c HCTZ in the future.

## 2015-08-03 NOTE — Progress Notes (Signed)
Subjective:    Patient ID: Nathan Krause, male    DOB: Aug 03, 1971, 44 y.o.   MRN: [id number]  HPI  Nathan Krause is a 44 year old male who presents today to establish care and discuss the problems mentioned below. Will obtain old records. Last physical was 1 year ago.   1) Gout: Diagnosed 5-6 years ago. No flare up since. Managed on allopurinol 300 mg daily.  2) Essential Hypertension: Diagnosed in 20's. Managed on HCTZ 25 mg. He lost about 100 pounds in the last several years. Denies chest pain, shortness of breath, headaches.   3) Type 2 Diabetes: Diagnosed in 2009. Managed on Metformin 500 mg BID. Last A1C was 6 months ago and was 7.9. He will get numbness/tingling/sharp pain intermittently to his bilateral lower extremities daily for the past year. It will last for several minutes.   4) Anxiety and Depression: Diagnosed in 2002 and has been managed on Celexa 20 mg for the past several years.  He feels well managed on the Celexa 20 mg. He's been out of his medication for the past 10 days and can notice a difference in his mood without the medication. He is requesting refills today. Denies SI/HI.   5) Hyperlipidemia: Currently managed on Simvastatin 20 mg. Denies myalgias. Last lipid panel was in March 2016 and was stable per patient.  Review of Systems  Constitutional: Negative for unexpected weight change.  HENT: Negative for rhinorrhea.   Respiratory: Negative for cough and shortness of breath.   Cardiovascular: Negative for chest pain.  Gastrointestinal: Negative for diarrhea and constipation.  Genitourinary: Negative for difficulty urinating.  Musculoskeletal: Positive for back pain and arthralgias.  Skin: Negative for rash.  Allergic/Immunologic: Positive for environmental allergies.  Neurological: Positive for numbness. Negative for dizziness and headaches.  Psychiatric/Behavioral:       See HPI       Past Medical History  Diagnosis Date  . Gout   . Arthritis   .  Essential hypertension   . Anxiety and depression   . Type 2 diabetes mellitus without complication (HCC)   . Hyperlipidemia   . Asthma   . Pulmonary embolism [facility])     Social History   Social History  . Marital Status: Married    Spouse Name: N/A  . Number of Children: N/A  . Years of Education: N/A   Occupational History  . Not on file.   Social History Main Topics  . Smoking status: Never Smoker   . Smokeless tobacco: Not on file  . Alcohol Use: 0.0 oz/week    0 Standard drinks or equivalent per week     Comment: social  . Drug Use: Not on file  . Sexual Activity: Not on file   Other Topics Concern  . Not on file   Social History Narrative   Married.   2 children.   Disabled and does not work.   Enjoys watching movies, working out, spending time with dogs, traveling.       Past Surgical History  Procedure Laterality Date  . Knee surgery Left   . Knee arthroscopy Right     Family History  Problem Relation Age of Onset  . Heart disease Father   . Heart attack Father 69    deceased  . Colon cancer Mother 42  . Breast cancer Mother     Allergies  Allergen Reactions  . Oxycodone-Acetaminophen Nausea Only    No current outpatient prescriptions on file prior to visit.  No current facility-administered medications on file prior to visit.    BP 116/78 mmHg  Pulse 75  Temp(Src) 97.6 F (36.4 C) (Oral)  Ht  (1.981 m)  Wt 310 lb (140.615 kg)  BMI 35.83 kg/m2  SpO2 97%    Objective:   Physical Exam  Constitutional: He is oriented to person, place, and time. He appears well-nourished.  Cardiovascular: Normal rate and regular rhythm.   Pulmonary/Chest: Effort normal and breath sounds normal.  Neurological: He is alert and oriented to person, place, and time.  Skin: Skin is warm and dry.  Psychiatric: He has a normal mood and affect.          Assessment & Plan:

## 2015-08-03 NOTE — Assessment & Plan Note (Signed)
Stable today. Managed on HCTZ 25 mg. Will consider discontinuing this as he's lost 100 pounds and has chronic gout.

## 2015-08-03 NOTE — Assessment & Plan Note (Signed)
Managed on simvastatin 20 mg. Last lipid panel WNL per patient 6 months ago. Will recheck at upcoming physical.

## 2015-08-03 NOTE — Progress Notes (Signed)
Pre visit review using our clinic review tool, if applicable. No additional management support is needed unless otherwise documented below in the visit note. 

## 2015-08-03 NOTE — Patient Instructions (Addendum)
Complete lab work prior to leaving today. I will notify you of your results.  Refills have been sent for your Celexa 20 mg through mail order.  Please schedule a physical with me in the next couple of months. You will also schedule a lab only appointment one week prior. We will discuss your lab results during your physical.  It was a pleasure to meet you today! Please don't hesitate to call me with any questions. Welcome to [facility]!

## 2015-08-06 ENCOUNTER — Other Ambulatory Visit: Payer: Self-pay | Admitting: Primary Care

## 2015-08-06 MED ORDER — SIMVASTATIN 20 MG PO TABS: 20.0000 mg | ORAL_TABLET | Freq: Every day | ORAL | Status: DC

## 2015-08-06 NOTE — Telephone Encounter (Signed)
Received refill request from [facility] for  simvastatin (ZOCOR) 20 MG tablet    Take 20 mg by mouth daily at 6 PM.  Dispense 90   Refills  1  Rx has not been prescribed by Jae Dire yet.  Last seen on 08/03/2015. CPE on 09/19/15.

## 2015-09-04 ENCOUNTER — Other Ambulatory Visit: Payer: Self-pay | Admitting: Primary Care

## 2015-09-04 DIAGNOSIS — M109 Gout, unspecified: Secondary | ICD-10-CM

## 2015-09-04 DIAGNOSIS — I10 Essential (primary) hypertension: Secondary | ICD-10-CM

## 2015-09-04 DIAGNOSIS — E785 Hyperlipidemia, unspecified: Secondary | ICD-10-CM

## 2015-09-12 ENCOUNTER — Other Ambulatory Visit (INDEPENDENT_AMBULATORY_CARE_PROVIDER_SITE_OTHER): Payer: Medicare HMO

## 2015-09-12 DIAGNOSIS — M109 Gout, unspecified: Secondary | ICD-10-CM

## 2015-09-12 DIAGNOSIS — I10 Essential (primary) hypertension: Secondary | ICD-10-CM | POA: Diagnosis not present

## 2015-09-12 DIAGNOSIS — E785 Hyperlipidemia, unspecified: Secondary | ICD-10-CM

## 2015-09-12 LAB — COMPREHENSIVE METABOLIC PANEL
ALT: 39 U/L (ref 0–53)
AST: 26 U/L (ref 0–37)
Albumin: 4.3 g/dL (ref 3.5–5.2)
Alkaline Phosphatase: 47 U/L (ref 39–117)
BUN: 12 mg/dL (ref 6–23)
CHLORIDE: 97 meq/L (ref 96–112)
CO2: 35 meq/L — AB (ref 19–32)
CREATININE: 1.13 mg/dL (ref 0.40–1.50)
Calcium: 9.6 mg/dL (ref 8.4–10.5)
GFR: 74.65 mL/min (ref >60.00)
Glucose, Bld: 125 mg/dL — ABNORMAL HIGH (ref 70–99)
Potassium: 3.6 mEq/L (ref 3.5–5.1)
SODIUM: 141 meq/L (ref 135–145)
Total Bilirubin: 0.4 mg/dL (ref 0.2–1.2)
Total Protein: 7.5 g/dL (ref 6.0–8.3)

## 2015-09-12 LAB — URIC ACID: Uric Acid, Serum: 6.6 mg/dL (ref 4.0–7.8)

## 2015-09-12 LAB — LIPID PANEL
Cholesterol: 145 mg/dL (ref 0–200)
HDL: 39.9 mg/dL (ref >39.00)
LDL Cholesterol: 82 mg/dL (ref 0–99)
NonHDL: 105.54
TRIGLYCERIDES: 119 mg/dL (ref 0.0–149.0)
Total CHOL/HDL Ratio: 4
VLDL: 23.8 mg/dL (ref 0.0–40.0)

## 2015-09-18 ENCOUNTER — Encounter: Payer: Self-pay | Admitting: Primary Care

## 2015-09-19 ENCOUNTER — Encounter: Payer: Self-pay | Admitting: Primary Care

## 2015-09-19 ENCOUNTER — Ambulatory Visit (INDEPENDENT_AMBULATORY_CARE_PROVIDER_SITE_OTHER): Payer: Medicare HMO | Admitting: Primary Care

## 2015-09-19 ENCOUNTER — Telehealth: Payer: Self-pay | Admitting: Primary Care

## 2015-09-19 VITALS — BP 122/82 | HR 88 | Temp 97.8°F | Ht 78.0 in | Wt 312.1 lb

## 2015-09-19 DIAGNOSIS — I10 Essential (primary) hypertension: Secondary | ICD-10-CM

## 2015-09-19 DIAGNOSIS — E119 Type 2 diabetes mellitus without complications: Secondary | ICD-10-CM

## 2015-09-19 DIAGNOSIS — E785 Hyperlipidemia, unspecified: Secondary | ICD-10-CM

## 2015-09-19 DIAGNOSIS — Z23 Encounter for immunization: Secondary | ICD-10-CM

## 2015-09-19 NOTE — Progress Notes (Signed)
Pre visit review using our clinic review tool, if applicable. No additional management support is needed unless otherwise documented below in the visit note. 

## 2015-09-19 NOTE — Patient Instructions (Addendum)
Schedule a lab only appointment after January 7th for re-check of your diabetes.  Please schedule a physical with me in April 2017.   It was a pleasure to see you today! Happy Thanksgiving!

## 2015-09-19 NOTE — Assessment & Plan Note (Signed)
Stable on HCTZ 25 mg.

## 2015-09-19 NOTE — Assessment & Plan Note (Signed)
A1C of 7.4 in early October. Metformin increased to 1000 mg BID at that point. Will repeat in early January. Will consider adding Glipizide if no improvement. He's working to improve his diet and start exercising. Foot exam completed today. Forgot to obtain urine microalbumin, will have him come in at his convenience.

## 2015-09-19 NOTE — Assessment & Plan Note (Addendum)
Currently managed on Simvastatin 20 mg. Lipid panel stable. No myalgias. LFT's WNL. Repeat in 1 year.

## 2015-09-19 NOTE — Progress Notes (Signed)
Subjective:    Patient ID: Nathan Krause, male    DOB: 14-May-1971, 44 y.o.   MRN: [id number]  HPI  Nathan Krause presents today for follow up of chronic conditions.  1) Hyperlipidemia: Managed on Simvastatin 20 mg. Lipid panel completed on 09/12/15 and is stable. Denies myalgias.   2) Essential Hypertension: Currently managed on HCTZ 25 mg. Denies chest pain, headaches, dizziness.  3) Type 2 Diabetes: Currently managed on Metformin 1000 mg BID. His A1C was completed on 08/03/15 and was 7.4. His Metformin was increased to 1000 mg BID last visit. Based off of records, his A1C in March 2016 was 7.2. He's had some improvement in numbness/tingling to his feet since increasing his metformin last visit.  He starting working to improve his diet 2 weeks ago. He's cut back on his soda consumption and has switched to sugar free/diet soda. He's increased his water consumption. He's cutting back on his carbohydrates (pastas, rice, breads), cutting back on red meat. He's also started to exercise 3-4 days weekly.  Wt Readings from Last 3 Encounters:  09/19/15 312 lb 1.9 oz (141.577 kg)  08/03/15 310 lb (140.615 kg)     Review of Systems  Respiratory: Negative for shortness of breath.   Cardiovascular: Negative for chest pain.  Neurological: Positive for numbness. Negative for dizziness and headaches.       Past Medical History  Diagnosis Date  . Gout   . Rheumatoid arthritis (HCC)   . Essential hypertension   . Anxiety and depression   . Type 2 diabetes mellitus without complication (HCC)   . Hyperlipidemia   . Asthma   . Pulmonary embolism (HCC)   . DVT (deep venous thrombosis) (HCC)     Social History   Social History  . Marital Status: Married    Spouse Name: N/A  . Number of Children: N/A  . Years of Education: N/A   Occupational History  . Not on file.   Social History Main Topics  . Smoking status: Never Smoker   . Smokeless tobacco: Not on file  . Alcohol Use: 0.0  oz/week    0 Standard drinks or equivalent per week     Comment: social  . Drug Use: Not on file  . Sexual Activity: Not on file   Other Topics Concern  . Not on file   Social History Narrative   Married.   2 children.   Disabled and does not work.   Enjoys watching movies, working out, spending time with dogs, traveling.       Past Surgical History  Procedure Laterality Date  . Knee surgery Left   . Knee arthroscopy Right     Family History  Problem Relation Age of Onset  . Heart disease Father   . Heart attack Father 48    deceased  . Colon cancer Mother 40  . Breast cancer Mother     Allergies  Allergen Reactions  . Oxycodone-Acetaminophen Nausea Only    Current Outpatient Prescriptions on File Prior to Visit  Medication Sig Dispense Refill  . albuterol (PROVENTIL HFA;VENTOLIN HFA) 108 (90 BASE) MCG/ACT inhaler Inhale 2 puffs into the lungs as needed.     Marland Kitchen allopurinol (ZYLOPRIM) 300 MG tablet Take 300 mg by mouth 2 (two) times daily.     Marland Kitchen aspirin 81 MG tablet Take 81 mg by mouth daily.     . citalopram (CELEXA) 20 MG tablet Take 1 tablet (20 mg total) by mouth daily. 90 tablet  3  . Glucosamine 500 MG CAPS Take 1,000 mg by mouth daily.     . hydrochlorothiazide (HYDRODIURIL) 25 MG tablet Take 25 mg by mouth daily.     . metFORMIN (GLUCOPHAGE) 1000 MG tablet Take 1 tablet (1,000 mg total) by mouth 2 (two) times daily with a meal. 180 tablet 3  . Multiple Vitamin (MULTIVITAMIN) capsule Take 1 capsule by mouth daily.     . simvastatin (ZOCOR) 20 MG tablet Take 1 tablet (20 mg total) by mouth daily at 6 PM. 90 tablet 2  . tadalafil (CIALIS) 5 MG tablet Take 10 mg by mouth daily as needed.      No current facility-administered medications on file prior to visit.    BP 122/82 mmHg  Pulse 88  Temp(Src) 97.8 F (36.6 C) (Oral)  Ht 6\' 6"  (1.981 m)  Wt 312 lb 1.9 oz (141.577 kg)  BMI 36.08 kg/m2  SpO2 98%    Objective:   Physical Exam  Constitutional: He  appears well-nourished.  Cardiovascular: Normal rate and regular rhythm.   Pulmonary/Chest: Effort normal and breath sounds normal.  Skin: Skin is warm and dry.          Assessment & Plan:  Based off of records received he had a physical in March 2016. Will do follow up of chronic illnesses rather that physical. Physical due in April 2017.

## 2015-09-19 NOTE — Telephone Encounter (Signed)
I forgot to have [name] do a urine microalbumin for his diabetes. Will you please schedule this soon at his convenience? I've placed the orders.

## 2015-09-24 ENCOUNTER — Telehealth: Payer: Self-pay | Admitting: Primary Care

## 2015-09-24 NOTE — Telephone Encounter (Signed)
Pt returned your call. He says you have the best number already and will be home the rest of the day  Thanks

## 2015-09-24 NOTE — Telephone Encounter (Signed)
Message left for patient to return my call.  

## 2015-09-24 NOTE — Telephone Encounter (Signed)
Called patient back and schedule lab appt on 09/25/2015

## 2015-09-25 ENCOUNTER — Other Ambulatory Visit: Payer: Medicare HMO

## 2015-09-25 DIAGNOSIS — E119 Type 2 diabetes mellitus without complications: Secondary | ICD-10-CM | POA: Diagnosis not present

## 2015-09-25 LAB — MICROALBUMIN / CREATININE URINE RATIO
CREATININE, U: 254 mg/dL
MICROALB UR: 1.9 mg/dL (ref 0.0–1.9)
Microalb Creat Ratio: 0.7 mg/g (ref 0.0–30.0)

## 2015-10-30 ENCOUNTER — Other Ambulatory Visit: Payer: Self-pay | Admitting: Primary Care

## 2015-10-30 DIAGNOSIS — E119 Type 2 diabetes mellitus without complications: Secondary | ICD-10-CM

## 2015-11-05 ENCOUNTER — Other Ambulatory Visit: Payer: Medicare HMO

## 2016-02-25 ENCOUNTER — Other Ambulatory Visit: Payer: Self-pay

## 2016-02-25 MED ORDER — HYDROCHLOROTHIAZIDE 25 MG PO TABS: 25.0000 mg | ORAL_TABLET | Freq: Every day | ORAL | Status: DC

## 2016-02-25 NOTE — Telephone Encounter (Signed)
Pt left v/m requesting refill HCTZ at [facility]. Pt has scheduled appts and last seen 09/19/15. Pt notified done per protocol.

## 2016-03-05 ENCOUNTER — Other Ambulatory Visit: Payer: Self-pay | Admitting: Primary Care

## 2016-03-07 ENCOUNTER — Telehealth: Payer: Self-pay | Admitting: Primary Care

## 2016-03-07 ENCOUNTER — Other Ambulatory Visit: Payer: Self-pay | Admitting: Primary Care

## 2016-03-07 NOTE — Telephone Encounter (Signed)
It looks like Nathan Krause is scheduled for another physical with me in 2 weeks, but he had a complete physical in November 2016. He is really just due for routine follow up with me. Okay to keep him in current slot, just an [place] for both Lesia and patient. He's also scheduled for an annual wellness visit with Virl AxeLesia, which is okay.  Please have him come in for his lab appointment as scheduled as he will need a repeat A1C.

## 2016-03-13 ENCOUNTER — Ambulatory Visit: Payer: Medicare HMO

## 2016-03-13 ENCOUNTER — Other Ambulatory Visit: Payer: Medicare HMO

## 2016-03-17 ENCOUNTER — Ambulatory Visit: Payer: Medicare HMO

## 2016-03-17 ENCOUNTER — Other Ambulatory Visit: Payer: Medicare HMO

## 2016-03-17 ENCOUNTER — Other Ambulatory Visit (INDEPENDENT_AMBULATORY_CARE_PROVIDER_SITE_OTHER): Payer: Medicare HMO

## 2016-03-17 DIAGNOSIS — E119 Type 2 diabetes mellitus without complications: Secondary | ICD-10-CM

## 2016-03-17 LAB — HEMOGLOBIN A1C: Hgb A1c MFr Bld: 7.1 % — ABNORMAL HIGH (ref 4.6–6.5)

## 2016-03-19 ENCOUNTER — Ambulatory Visit (INDEPENDENT_AMBULATORY_CARE_PROVIDER_SITE_OTHER): Payer: Medicare HMO | Admitting: Primary Care

## 2016-03-19 VITALS — BP 122/82 | HR 94 | Temp 98.1°F | Ht 78.0 in | Wt 305.8 lb

## 2016-03-19 DIAGNOSIS — M1A9XX0 Chronic gout, unspecified, without tophus (tophi): Secondary | ICD-10-CM

## 2016-03-19 DIAGNOSIS — E785 Hyperlipidemia, unspecified: Secondary | ICD-10-CM

## 2016-03-19 DIAGNOSIS — E119 Type 2 diabetes mellitus without complications: Secondary | ICD-10-CM

## 2016-03-19 DIAGNOSIS — Z0000 Encounter for general adult medical examination without abnormal findings: Secondary | ICD-10-CM | POA: Diagnosis not present

## 2016-03-19 DIAGNOSIS — E1165 Type 2 diabetes mellitus with hyperglycemia: Secondary | ICD-10-CM

## 2016-03-19 DIAGNOSIS — F418 Other specified anxiety disorders: Secondary | ICD-10-CM

## 2016-03-19 DIAGNOSIS — I10 Essential (primary) hypertension: Secondary | ICD-10-CM

## 2016-03-19 DIAGNOSIS — IMO0001 Reserved for inherently not codable concepts without codable children: Secondary | ICD-10-CM

## 2016-03-19 DIAGNOSIS — F419 Anxiety disorder, unspecified: Principal | ICD-10-CM

## 2016-03-19 DIAGNOSIS — F329 Major depressive disorder, single episode, unspecified: Secondary | ICD-10-CM

## 2016-03-19 MED ORDER — CITALOPRAM HYDROBROMIDE 20 MG PO TABS: 20.0000 mg | ORAL_TABLET | Freq: Every day | ORAL | Status: DC

## 2016-03-19 MED ORDER — SIMVASTATIN 20 MG PO TABS: 20.0000 mg | ORAL_TABLET | Freq: Every day | ORAL | Status: DC

## 2016-03-19 MED ORDER — HYDROCHLOROTHIAZIDE 25 MG PO TABS
25.0000 mg | ORAL_TABLET | Freq: Every day | ORAL | Status: DC
Start: 2016-03-19 — End: 2016-09-22

## 2016-03-19 MED ORDER — METFORMIN HCL 1000 MG PO TABS: 1000.0000 mg | ORAL_TABLET | Freq: Two times a day (BID) | ORAL | Status: DC

## 2016-03-19 NOTE — Assessment & Plan Note (Signed)
No attacks since last visit.

## 2016-03-19 NOTE — Assessment & Plan Note (Signed)
Stable on HCTZ, continue same.  

## 2016-03-19 NOTE — Assessment & Plan Note (Signed)
Td and pneumonia UTD. Commended him on his weight loss, encouraged to continue. Labs stable today. Exam unremarkable. Highly recommend an annual eye exam, he will schedule. Discussed the importance of a healthy diet and regular exercise in order for weight loss and to reduce risk of other medical diseases.  Follow up in 1 year for repeat physical, 6 months for A1C and lipid recheck.

## 2016-03-19 NOTE — Progress Notes (Signed)
Pre visit review using our clinic review tool, if applicable. No additional management support is needed unless otherwise documented below in the visit note. 

## 2016-03-19 NOTE — Patient Instructions (Addendum)
Continue your efforts towards a healthy lifestyle.  Reduce: Pasta, rice, sugary drinks, processed carbohydrates Increase: Lean protein, vegetables, whole grains, sweets.  Continue to slowly advance exercise as tolerated.  Follow up in 6 months for follow up of diabetes.  It was a pleasure to see you today!  Diabetes Mellitus and Food It is important for you to manage your blood sugar (glucose) level. Your blood glucose level can be greatly affected by what you eat. Eating healthier foods in the appropriate amounts throughout the day at about the same time each day will help you control your blood glucose level. It can also help slow or prevent worsening of your diabetes mellitus. Healthy eating may even help you improve the level of your blood pressure and reach or maintain a healthy weight.  General recommendations for healthful eating and cooking habits include:  Eating meals and snacks regularly. Avoid going long periods of time without eating to lose weight.  Eating a diet that consists mainly of plant-based foods, such as fruits, vegetables, nuts, legumes, and whole grains.  Using low-heat cooking methods, such as baking, instead of high-heat cooking methods, such as deep frying. Work with your dietitian to make sure you understand how to use the Nutrition Facts information on food labels. HOW CAN FOOD AFFECT ME? Carbohydrates Carbohydrates affect your blood glucose level more than any other type of food. Your dietitian will help you determine how many carbohydrates to eat at each meal and teach you how to count carbohydrates. Counting carbohydrates is important to keep your blood glucose at a healthy level, especially if you are using insulin or taking certain medicines for diabetes mellitus. Alcohol Alcohol can cause sudden decreases in blood glucose (hypoglycemia), especially if you use insulin or take certain medicines for diabetes mellitus. Hypoglycemia can be a life-threatening  condition. Symptoms of hypoglycemia (sleepiness, dizziness, and disorientation) are similar to symptoms of having too much alcohol.  If your health care provider has given you approval to drink alcohol, do so in moderation and use the following guidelines:  Women should not have more than one drink per day, and men should not have more than two drinks per day. One drink is equal to:  12 oz of beer.  5 oz of wine.  1 oz of hard liquor.  Do not drink on an empty stomach.  Keep yourself hydrated. Have water, diet soda, or unsweetened iced tea.  Regular soda, juice, and other mixers might contain a lot of carbohydrates and should be counted. WHAT FOODS ARE NOT RECOMMENDED? As you make food choices, it is important to remember that all foods are not the same. Some foods have fewer nutrients per serving than other foods, even though they might have the same number of calories or carbohydrates. It is difficult to get your body what it needs when you eat foods with fewer nutrients. Examples of foods that you should avoid that are high in calories and carbohydrates but low in nutrients include:  Trans fats (most processed foods list trans fats on the Nutrition Facts label).  Regular soda.  Juice.  Candy.  Sweets, such as cake, pie, doughnuts, and cookies.  Fried foods. WHAT FOODS CAN I EAT? Eat nutrient-rich foods, which will nourish your body and keep you healthy. The food you should eat also will depend on several factors, including:  The calories you need.  The medicines you take.  Your weight.  Your blood glucose level.  Your blood pressure level.  Your cholesterol level.  You should eat a variety of foods, including:  Protein.  Lean cuts of meat.  Proteins low in saturated fats, such as fish, egg whites, and beans. Avoid processed meats.  Fruits and vegetables.  Fruits and vegetables that may help control blood glucose levels, such as apples, mangoes, and  yams.  Dairy products.  Choose fat-free or low-fat dairy products, such as milk, yogurt, and cheese.  Grains, bread, pasta, and rice.  Choose whole grain products, such as multigrain bread, whole oats, and brown rice. These foods may help control blood pressure.  Fats.  Foods containing healthful fats, such as nuts, avocado, olive oil, canola oil, and fish. DOES EVERYONE WITH DIABETES MELLITUS HAVE THE SAME MEAL PLAN? Because every person with diabetes mellitus is different, there is not one meal plan that works for everyone. It is very important that you meet with a dietitian who will help you create a meal plan that is just right for you.   This information is not intended to replace advice given to you by your health care provider. Make sure you discuss any questions you have with your health care provider.   Document Released: 07/10/2005 Document Revised: 11/03/2014 Document Reviewed: 09/09/2013 Elsevier Interactive Patient Education [employer]2016 Elsevier Inc.

## 2016-03-19 NOTE — Assessment & Plan Note (Signed)
Improvement in A1C from 7.4 to 7.1. He is working to lose weight through healthy diet and exercise. Will make no adjustments to meds at this point. Re-check A1C in 6 months. Urine microalbumin normal, managed on statin, pneumonia vaccination UTD.

## 2016-03-19 NOTE — Progress Notes (Signed)
Subjective:    Patient ID: Nathan Krause, male    DOB: [birth year], 45 y.o.   MRN: [id number]  HPI  Nathan Krause is a 45 year old male who presents today for complete physical.  Immunizations: -Tetanus: Completed in 2008. -Influenza: Completed in November 2016. -Pneumonia: Completed in August 10, 2015 from prior PCP.   Diet: He endorses a fair diet. Breakfast: Oatmeal, raisin bran Lunch: [place] sandwich, chips,  Dinner: Eats out, pasta, protein, baked potato, bread, salads, vegetables Snacks: Nuts, fruit, cheese Desserts: Three times weekly Beverages: Water, occasional gatorade, occasional mountain dew, unsweet tea  Exercise: He is starting to get back into the gym. Eye exam: Completed several years ago. No recent exam. Dental exam: Completed 20 years ago.  Wt Readings from Last 3 Encounters:  03/19/16 305 lb 12.8 oz (138.71 kg)  09/19/15 312 lb 1.9 oz (141.577 kg)  08/03/15 310 lb (140.615 kg)      Review of Systems  Constitutional: Negative for unexpected weight change.  HENT: Negative for rhinorrhea.   Respiratory: Negative for cough and shortness of breath.   Cardiovascular: Negative for chest pain.  Gastrointestinal: Negative for diarrhea and constipation.  Genitourinary: Negative for difficulty urinating.  Musculoskeletal: Positive for arthralgias. Negative for myalgias.  Skin: Negative for rash.  Allergic/Immunologic: Positive for environmental allergies.  Neurological: Positive for numbness. Negative for dizziness and headaches.  Psychiatric/Behavioral:       Denies concerns for anxiety or depression. Feels well managed on Celexa.       Past Medical History  Diagnosis Date  . Gout   . Rheumatoid arthritis (HCC)   . Essential hypertension   . Anxiety and depression   . Type 2 diabetes mellitus without complication (HCC)   . Hyperlipidemia   . Asthma   . Pulmonary embolism (HCC)   . DVT (deep venous thrombosis) (HCC)      Social History   Social  History  . Marital Status: Married    Spouse Name: N/A  . Number of Children: N/A  . Years of Education: N/A   Occupational History  . Not on file.   Social History Main Topics  . Smoking status: Never Smoker   . Smokeless tobacco: Not on file  . Alcohol Use: 0.0 oz/week    0 Standard drinks or equivalent per week     Comment: social  . Drug Use: Not on file  . Sexual Activity: Not on file   Other Topics Concern  . Not on file   Social History Narrative   Married.   2 children.   Disabled and does not work.   Enjoys watching movies, working out, spending time with dogs, traveling.       Past Surgical History  Procedure Laterality Date  . Knee surgery Left   . Knee arthroscopy Right     Family History  Problem Relation Age of Onset  . Heart disease Father   . Heart attack Father 44    deceased  . Colon cancer Mother 69  . Breast cancer Mother     Allergies  Allergen Reactions  . Oxycodone-Acetaminophen Nausea Only    Current Outpatient Prescriptions on File Prior to Visit  Medication Sig Dispense Refill  . albuterol (PROVENTIL HFA;VENTOLIN HFA) 108 (90 BASE) MCG/ACT inhaler Inhale 2 puffs into the lungs as needed.     Marland Kitchen aspirin 81 MG tablet Take 81 mg by mouth daily.     . Glucosamine 500 MG CAPS Take 1,000 mg by  mouth daily.     . Multiple Vitamin (MULTIVITAMIN) capsule Take 1 capsule by mouth daily.     . tadalafil (CIALIS) 5 MG tablet Take 10 mg by mouth daily as needed.      No current facility-administered medications on file prior to visit.    BP 122/82 mmHg  Pulse 94  Temp(Src) 98.1 F (36.7 C) (Oral)  Ht 6\' 6"  (1.981 m)  Wt 305 lb 12.8 oz (138.71 kg)  BMI 35.35 kg/m2  SpO2 97%    Objective:   Physical Exam  Constitutional: He is oriented to person, place, and time. He appears well-nourished.  HENT:  Right Ear: Tympanic membrane and ear canal normal.  Left Ear: Tympanic membrane and ear canal normal.  Nose: Nose normal. Right sinus  exhibits no maxillary sinus tenderness and no frontal sinus tenderness. Left sinus exhibits no maxillary sinus tenderness and no frontal sinus tenderness.  Mouth/Throat: Oropharynx is clear and moist.  Eyes: Conjunctivae and EOM are normal. Pupils are equal, round, and reactive to light.  Neck: Neck supple. Carotid bruit is not present. No thyromegaly present.  Cardiovascular: Normal rate, regular rhythm and normal heart sounds.   Pulmonary/Chest: Effort normal and breath sounds normal. He has no wheezes. He has no rales.  Abdominal: Soft. Bowel sounds are normal. There is no tenderness.  Musculoskeletal: Normal range of motion.  Neurological: He is alert and oriented to person, place, and time. He has normal reflexes. No cranial nerve deficit.  Skin: Skin is warm and dry.  Psychiatric: He has a normal mood and affect.          Assessment & Plan:

## 2016-03-19 NOTE — Assessment & Plan Note (Signed)
Feels well managed on Celexa. Continue same. 

## 2016-03-19 NOTE — Assessment & Plan Note (Signed)
Lipids stable on simvastatin in November. Will repeat in 6 months.

## 2016-09-11 NOTE — Telephone Encounter (Signed)
Opened in error

## 2016-09-22 ENCOUNTER — Encounter: Payer: Self-pay | Admitting: Primary Care

## 2016-09-22 ENCOUNTER — Ambulatory Visit (INDEPENDENT_AMBULATORY_CARE_PROVIDER_SITE_OTHER): Payer: Medicare HMO | Admitting: Primary Care

## 2016-09-22 VITALS — BP 124/80 | HR 91 | Temp 98.0°F | Ht 78.0 in | Wt 307.0 lb

## 2016-09-22 DIAGNOSIS — IMO0001 Reserved for inherently not codable concepts without codable children: Secondary | ICD-10-CM

## 2016-09-22 DIAGNOSIS — E119 Type 2 diabetes mellitus without complications: Secondary | ICD-10-CM | POA: Diagnosis not present

## 2016-09-22 DIAGNOSIS — E1165 Type 2 diabetes mellitus with hyperglycemia: Secondary | ICD-10-CM | POA: Diagnosis not present

## 2016-09-22 DIAGNOSIS — F329 Major depressive disorder, single episode, unspecified: Secondary | ICD-10-CM

## 2016-09-22 DIAGNOSIS — E785 Hyperlipidemia, unspecified: Secondary | ICD-10-CM | POA: Diagnosis not present

## 2016-09-22 DIAGNOSIS — F418 Other specified anxiety disorders: Secondary | ICD-10-CM

## 2016-09-22 DIAGNOSIS — F419 Anxiety disorder, unspecified: Secondary | ICD-10-CM

## 2016-09-22 DIAGNOSIS — I10 Essential (primary) hypertension: Secondary | ICD-10-CM | POA: Diagnosis not present

## 2016-09-22 DIAGNOSIS — F32A Depression, unspecified: Secondary | ICD-10-CM

## 2016-09-22 LAB — LIPID PANEL
CHOLESTEROL: 150 mg/dL (ref 0–200)
HDL: 38.3 mg/dL — AB (ref >39.00)
LDL CALC: 76 mg/dL (ref 0–99)
NonHDL: 111.62
TRIGLYCERIDES: 177 mg/dL — AB (ref 0.0–149.0)
Total CHOL/HDL Ratio: 4
VLDL: 35.4 mg/dL (ref 0.0–40.0)

## 2016-09-22 LAB — COMPREHENSIVE METABOLIC PANEL
ALBUMIN: 4.4 g/dL (ref 3.5–5.2)
ALK PHOS: 52 U/L (ref 39–117)
ALT: 47 U/L (ref 0–53)
AST: 28 U/L (ref 0–37)
BILIRUBIN TOTAL: 0.5 mg/dL (ref 0.2–1.2)
BUN: 11 mg/dL (ref 6–23)
CALCIUM: 9.9 mg/dL (ref 8.4–10.5)
CO2: 31 meq/L (ref 19–32)
CREATININE: 1.19 mg/dL (ref 0.40–1.50)
Chloride: 99 mEq/L (ref 96–112)
GFR: 70 mL/min (ref >60.00)
Glucose, Bld: 123 mg/dL — ABNORMAL HIGH (ref 70–99)
Potassium: 3.8 mEq/L (ref 3.5–5.1)
Sodium: 140 mEq/L (ref 135–145)
TOTAL PROTEIN: 7.7 g/dL (ref 6.0–8.3)

## 2016-09-22 LAB — MICROALBUMIN / CREATININE URINE RATIO
Creatinine,U: 378.4 mg/dL
Microalb Creat Ratio: 5.5 mg/g (ref 0.0–30.0)
Microalb, Ur: 20.9 mg/dL — ABNORMAL HIGH (ref 0.0–1.9)

## 2016-09-22 LAB — HEMOGLOBIN A1C: Hgb A1c MFr Bld: 6.9 % — ABNORMAL HIGH (ref 4.6–6.5)

## 2016-09-22 MED ORDER — HYDROCHLOROTHIAZIDE 25 MG PO TABS: 25.0000 mg | ORAL_TABLET | Freq: Every day | ORAL | 2 refills | Status: DC

## 2016-09-22 MED ORDER — CITALOPRAM HYDROBROMIDE 20 MG PO TABS: 20.0000 mg | ORAL_TABLET | Freq: Every day | ORAL | 3 refills | Status: DC

## 2016-09-22 NOTE — Assessment & Plan Note (Signed)
Managed on Simvastatin 20 mg. Due for repeat lipids today. Discussed importance of healthy diet and exercise.

## 2016-09-22 NOTE — Assessment & Plan Note (Signed)
Compliant to Metformin.  Due for A1C and urine microalbumin today. Foot exam due in May 2018. Managed on statin.

## 2016-09-22 NOTE — Patient Instructions (Addendum)
Complete lab work prior to leaving today. I will notify you of your results once received.   I sent refills of HCTZ 25 mg, and Celexa to your pharmacy. I will send in refills of Simvastatin and Metformin once I receive your labs.  Continue to make improvements in your diet. Please limit carbohydrates in the form of white bread, rice, pasta, sweets, fast food, fried food, sugary drinks, etc. Increase your consumption of fresh fruits and vegetables, whole grains, lean protein.  Ensure you are consuming 64 ounces of water daily.   Follow up in May 2018 for your annual physical or sooner if needed.  It was a pleasure to see you today!  Diabetes Mellitus and Food It is important for you to manage your blood sugar (glucose) level. Your blood glucose level can be greatly affected by what you eat. Eating healthier foods in the appropriate amounts throughout the day at about the same time each day will help you control your blood glucose level. It can also help slow or prevent worsening of your diabetes mellitus. Healthy eating may even help you improve the level of your blood pressure and reach or maintain a healthy weight. General recommendations for healthful eating and cooking habits include:  Eating meals and snacks regularly. Avoid going long periods of time without eating to lose weight.  Eating a diet that consists mainly of plant-based foods, such as fruits, vegetables, nuts, legumes, and whole grains.  Using low-heat cooking methods, such as baking, instead of high-heat cooking methods, such as deep frying. Work with your dietitian to make sure you understand how to use the Nutrition Facts information on food labels. How can food affect me? Carbohydrates  Carbohydrates affect your blood glucose level more than any other type of food. Your dietitian will help you determine how many carbohydrates to eat at each meal and teach you how to count carbohydrates. Counting carbohydrates is important  to keep your blood glucose at a healthy level, especially if you are using insulin or taking certain medicines for diabetes mellitus. Alcohol  Alcohol can cause sudden decreases in blood glucose (hypoglycemia), especially if you use insulin or take certain medicines for diabetes mellitus. Hypoglycemia can be a life-threatening condition. Symptoms of hypoglycemia (sleepiness, dizziness, and disorientation) are similar to symptoms of having too much alcohol. If your health care provider has given you approval to drink alcohol, do so in moderation and use the following guidelines:  Women should not have more than one drink per day, and men should not have more than two drinks per day. One drink is equal to:  12 oz of beer.  5 oz of wine.  1 oz of hard liquor.  Do not drink on an empty stomach.  Keep yourself hydrated. Have water, diet soda, or unsweetened iced tea.  Regular soda, juice, and other mixers might contain a lot of carbohydrates and should be counted. What foods are not recommended? As you make food choices, it is important to remember that all foods are not the same. Some foods have fewer nutrients per serving than other foods, even though they might have the same number of calories or carbohydrates. It is difficult to get your body what it needs when you eat foods with fewer nutrients. Examples of foods that you should avoid that are high in calories and carbohydrates but low in nutrients include:  Trans fats (most processed foods list trans fats on the Nutrition Facts label).  Regular soda.  Juice.  Candy.  Sweets, such as cake, pie, doughnuts, and cookies.  Fried foods. What foods can I eat? Eat nutrient-rich foods, which will nourish your body and keep you healthy. The food you should eat also will depend on several factors, including:  The calories you need.  The medicines you take.  Your weight.  Your blood glucose level.  Your blood pressure level.  Your  cholesterol level. You should eat a variety of foods, including:  Protein.  Lean cuts of meat.  Proteins low in saturated fats, such as fish, egg whites, and beans. Avoid processed meats.  Fruits and vegetables.  Fruits and vegetables that may help control blood glucose levels, such as apples, mangoes, and yams.  Dairy products.  Choose fat-free or low-fat dairy products, such as milk, yogurt, and cheese.  Grains, bread, pasta, and rice.  Choose whole grain products, such as multigrain bread, whole oats, and brown rice. These foods may help control blood pressure.  Fats.  Foods containing healthful fats, such as nuts, avocado, olive oil, canola oil, and fish. Does everyone with diabetes mellitus have the same meal plan? Because every person with diabetes mellitus is different, there is not one meal plan that works for everyone. It is very important that you meet with a dietitian who will help you create a meal plan that is just right for you. This information is not intended to replace advice given to you by your health care provider. Make sure you discuss any questions you have with your health care provider. Document Released: 07/10/2005 Document Revised: 03/20/2016 Document Reviewed: 09/09/2013 Elsevier Interactive Patient Education  2017 [employer]

## 2016-09-22 NOTE — Progress Notes (Signed)
Pre visit review using our clinic review tool, if applicable. No additional management support is needed unless otherwise documented below in the visit note. 

## 2016-09-22 NOTE — Assessment & Plan Note (Signed)
Stable on Celexa, refill provided today.

## 2016-09-22 NOTE — Assessment & Plan Note (Signed)
Stable on HCTZ 25 mg.  Continue same, CMP pending.

## 2016-09-22 NOTE — Progress Notes (Signed)
Subjective:    Patient ID: Nathan Krause, male    DOB: Feb 12, 1971, 45 y.o.   MRN: [id number]  HPI  Nathan Krause is a 45 year old male who presents today for follow up.  1) Type 2 Diabetes: Currently managed on Metformin 1000 mg twice daily. Last A1C in May 2017 at 7.1. He is due for repeat A1C and urine microalbumin today. He denies dizziness, weakness, numbness/tingling.   He's working on improvements in his diet by eliminating fast food. He's also cut back on sweets. His foot exam was completed in May 2017.  Wt Readings from Last 3 Encounters:  09/22/16 (!) 307 lb (139.3 kg)  03/19/16 (!) 305 lb 12.8 oz (138.7 kg)  09/19/15 (!) 312 lb 1.9 oz (141.6 kg)     2) Essential Hypertension: Currently managed on HCTZ 25 mg. He denies chest pain, dizziness, visual changes. His BP in the office today is stable.  3) Hyperlipidemia: Currently managed on Simvastatin 20 mg. He is due for repeat lipids today. He denies myalgias.   Review of Systems  Eyes: Negative for visual disturbance.  Respiratory: Negative for shortness of breath.   Cardiovascular: Negative for chest pain.  Musculoskeletal: Negative for myalgias.  Neurological: Negative for dizziness, numbness and headaches.       Past Medical History:  Diagnosis Date  . Anxiety and depression   . Asthma   . DVT (deep venous thrombosis) (HCC)   . Essential hypertension   . Gout   . Hyperlipidemia   . Pulmonary embolism (HCC)   . Rheumatoid arthritis (HCC)   . Type 2 diabetes mellitus without complication [facility](HCC)      Social History   Social History  . Marital status: Married    Spouse name: N/A  . Number of children: N/A  . Years of education: N/A   Occupational History  . Not on file.   Social History Main Topics  . Smoking status: Never Smoker  . Smokeless tobacco: Not on file  . Alcohol use 0.0 oz/week     Comment: social  . Drug use: Unknown  . Sexual activity: Not on file   Other Topics Concern  . Not on file     Social History Narrative   Married.   2 children.   Disabled and does not work.   Enjoys watching movies, working out, spending time with dogs, traveling.       Past Surgical History:  Procedure Laterality Date  . KNEE ARTHROSCOPY Right   . KNEE SURGERY Left     Family History  Problem Relation Age of Onset  . Heart disease Father   . Heart attack Father [id number]    deceased  . Colon cancer Mother 1366  . Breast cancer Mother     Allergies  Allergen Reactions  . Oxycodone-Acetaminophen Nausea Only    Current Outpatient Prescriptions on File Prior to Visit  Medication Sig Dispense Refill  . albuterol (PROVENTIL HFA;VENTOLIN HFA) 108 (90 BASE) MCG/ACT inhaler Inhale 2 puffs into the lungs as needed.     Marland Kitchen. aspirin 81 MG tablet Take 81 mg by mouth daily.     . Glucosamine 500 MG CAPS Take 1,000 mg by mouth daily.     . metFORMIN (GLUCOPHAGE) 1000 MG tablet Take 1 tablet (1,000 mg total) by mouth 2 (two) times daily with a meal. 180 tablet 3  . Multiple Vitamin (MULTIVITAMIN) capsule Take 1 capsule by mouth daily.     . simvastatin (ZOCOR) 20  MG tablet Take 1 tablet (20 mg total) by mouth daily at 6 PM. 90 tablet 2  . tadalafil (CIALIS) 5 MG tablet Take 10 mg by mouth daily as needed.      No current facility-administered medications on file prior to visit.     BP 124/80   Pulse 91   Temp 98 F (36.7 C) (Oral)   Ht 6\' 6"  (1.981 m)   Wt (!) 307 lb (139.3 kg)   SpO2 97%   BMI 35.48 kg/m    Objective:   Physical Exam  Constitutional: He appears well-nourished.  Neck: Neck supple.  Cardiovascular: Normal rate and regular rhythm.   Pulmonary/Chest: Effort normal and breath sounds normal.  Skin: Skin is warm and dry.          Assessment & Plan:

## 2016-09-24 ENCOUNTER — Other Ambulatory Visit: Payer: Self-pay | Admitting: Primary Care

## 2016-09-24 DIAGNOSIS — E785 Hyperlipidemia, unspecified: Secondary | ICD-10-CM

## 2016-09-24 DIAGNOSIS — I10 Essential (primary) hypertension: Secondary | ICD-10-CM

## 2016-09-24 MED ORDER — SIMVASTATIN 20 MG PO TABS: 20.0000 mg | ORAL_TABLET | Freq: Every day | ORAL | 2 refills | Status: DC

## 2016-09-24 MED ORDER — LISINOPRIL 10 MG PO TABS: 10.0000 mg | ORAL_TABLET | Freq: Every day | ORAL | 1 refills | Status: DC

## 2017-01-20 ENCOUNTER — Encounter: Payer: Self-pay | Admitting: Emergency Medicine

## 2017-01-20 ENCOUNTER — Emergency Department
Admission: EM | Admit: 2017-01-20 | Discharge: 2017-01-20 | Disposition: A | Discharge status: Home / Self Care | Payer: Medicare HMO | Attending: Emergency Medicine | Admitting: Emergency Medicine

## 2017-01-20 ENCOUNTER — Emergency Department: Payer: Medicare HMO

## 2017-01-20 DIAGNOSIS — I10 Essential (primary) hypertension: Secondary | ICD-10-CM | POA: Insufficient documentation

## 2017-01-20 DIAGNOSIS — E119 Type 2 diabetes mellitus without complications: Secondary | ICD-10-CM | POA: Diagnosis not present

## 2017-01-20 DIAGNOSIS — N200 Calculus of kidney: Secondary | ICD-10-CM | POA: Diagnosis not present

## 2017-01-20 DIAGNOSIS — Z79899 Other long term (current) drug therapy: Secondary | ICD-10-CM | POA: Diagnosis not present

## 2017-01-20 DIAGNOSIS — Z7984 Long term (current) use of oral hypoglycemic drugs: Secondary | ICD-10-CM | POA: Diagnosis not present

## 2017-01-20 DIAGNOSIS — R1032 Left lower quadrant pain: Secondary | ICD-10-CM | POA: Diagnosis present

## 2017-01-20 DIAGNOSIS — J45909 Unspecified asthma, uncomplicated: Secondary | ICD-10-CM | POA: Insufficient documentation

## 2017-01-20 DIAGNOSIS — Z7982 Long term (current) use of aspirin: Secondary | ICD-10-CM | POA: Insufficient documentation

## 2017-01-20 LAB — URINALYSIS, COMPLETE (UACMP) WITH MICROSCOPIC
BILIRUBIN URINE: NEGATIVE
Bacteria, UA: NONE SEEN
Glucose, UA: NEGATIVE mg/dL
Ketones, ur: NEGATIVE mg/dL
LEUKOCYTES UA: NEGATIVE
Nitrite: NEGATIVE
PH: 5 (ref 5.0–8.0)
Protein, ur: NEGATIVE mg/dL
SQUAMOUS EPITHELIAL / LPF: NONE SEEN
Specific Gravity, Urine: >1.046 — ABNORMAL HIGH (ref 1.005–1.030)

## 2017-01-20 LAB — COMPREHENSIVE METABOLIC PANEL
ALT: 79 U/L — ABNORMAL HIGH (ref 17–63)
ANION GAP: 12 (ref 5–15)
AST: 150 U/L — AB (ref 15–41)
Albumin: 4.6 g/dL (ref 3.5–5.0)
Alkaline Phosphatase: 59 U/L (ref 38–126)
BUN: 12 mg/dL (ref 6–20)
CHLORIDE: 105 mmol/L (ref 101–111)
CO2: 23 mmol/L (ref 22–32)
Calcium: 9.3 mg/dL (ref 8.9–10.3)
Creatinine, Ser: 1.2 mg/dL (ref 0.61–1.24)
Glucose, Bld: 183 mg/dL — ABNORMAL HIGH (ref 65–99)
POTASSIUM: 3.8 mmol/L (ref 3.5–5.1)
Sodium: 140 mmol/L (ref 135–145)
Total Bilirubin: 0.5 mg/dL (ref 0.3–1.2)
Total Protein: 7.9 g/dL (ref 6.5–8.1)

## 2017-01-20 LAB — CBC
HEMATOCRIT: 44.4 % (ref 40.0–52.0)
HEMOGLOBIN: 15.2 g/dL (ref 13.0–18.0)
MCH: 29.8 pg (ref 26.0–34.0)
MCHC: 34.3 g/dL (ref 32.0–36.0)
MCV: 87 fL (ref 80.0–100.0)
PLATELETS: 254 10*3/uL (ref 150–440)
RBC: 5.11 MIL/uL (ref 4.40–5.90)
RDW: 13.3 % (ref 11.5–14.5)
WBC: 9.3 10*3/uL (ref 3.8–10.6)

## 2017-01-20 LAB — LIPASE, BLOOD: LIPASE: 20 U/L (ref 11–51)

## 2017-01-20 MED ORDER — ONDANSETRON HCL 4 MG/2ML IJ SOLN
4.0000 mg | Freq: Once | INTRAMUSCULAR | Status: AC
  Administered 2017-01-20: 4 mg via INTRAVENOUS

## 2017-01-20 MED ORDER — MORPHINE SULFATE (PF) 4 MG/ML IV SOLN
4.0000 mg | Freq: Once | INTRAVENOUS | Status: AC
  Administered 2017-01-20: 4 mg via INTRAVENOUS

## 2017-01-20 MED ORDER — ONDANSETRON HCL 4 MG/2ML IJ SOLN
INTRAMUSCULAR | Status: AC
  Administered 2017-01-20: 4 mg via INTRAVENOUS
  Filled 2017-01-20: qty 2

## 2017-01-20 MED ORDER — KETOROLAC TROMETHAMINE 30 MG/ML IJ SOLN
INTRAMUSCULAR | Status: AC
  Administered 2017-01-20: 30 mg via INTRAVENOUS
  Filled 2017-01-20: qty 1

## 2017-01-20 MED ORDER — TAMSULOSIN HCL 0.4 MG PO CAPS: 0.4000 mg | ORAL_CAPSULE | Freq: Every day | ORAL | 0 refills | Status: DC

## 2017-01-20 MED ORDER — MORPHINE SULFATE (PF) 4 MG/ML IV SOLN
INTRAVENOUS | Status: AC
  Administered 2017-01-20: 4 mg via INTRAVENOUS
  Filled 2017-01-20: qty 1

## 2017-01-20 MED ORDER — ONDANSETRON 4 MG PO TBDP: 4.0000 mg | ORAL_TABLET | Freq: Three times a day (TID) | ORAL | 0 refills | Status: DC | PRN

## 2017-01-20 MED ORDER — OXYCODONE-ACETAMINOPHEN 5-325 MG PO TABS: 1.0000 | ORAL_TABLET | ORAL | 0 refills | Status: DC | PRN

## 2017-01-20 MED ORDER — IOPAMIDOL (ISOVUE-300) INJECTION 61%
30.0000 mL | Freq: Once | INTRAVENOUS | Status: AC
  Administered 2017-01-20: 30 mL via ORAL

## 2017-01-20 MED ORDER — KETOROLAC TROMETHAMINE 30 MG/ML IJ SOLN
30.0000 mg | Freq: Once | INTRAMUSCULAR | Status: AC
  Administered 2017-01-20: 30 mg via INTRAVENOUS

## 2017-01-20 MED ORDER — SODIUM CHLORIDE 0.9 % IV BOLUS (SEPSIS)
1000.0000 mL | Freq: Once | INTRAVENOUS | Status: AC
  Administered 2017-01-20: 1000 mL via INTRAVENOUS

## 2017-01-20 MED ORDER — IOPAMIDOL (ISOVUE-300) INJECTION 61%
100.0000 mL | Freq: Once | INTRAVENOUS | Status: AC | PRN
  Administered 2017-01-20: 100 mL via INTRAVENOUS

## 2017-01-20 MED ORDER — HYDROCODONE-ACETAMINOPHEN 5-325 MG PO TABS: 1.0000 | ORAL_TABLET | Freq: Four times a day (QID) | ORAL | 0 refills | Status: DC | PRN

## 2017-01-20 NOTE — ED Notes (Signed)
Pt vomiting after drinking contrast.  EDP notified and VO given for zofran 4mg  IV and EDP okay with pt going to CT without oral contrast at this time.  CT notified.

## 2017-01-20 NOTE — ED Notes (Signed)
Pt aware the need for urine specimen.

## 2017-01-20 NOTE — ED Provider Notes (Signed)
 -----------------------------------------   9:24 AM on 01/20/2017 -----------------------------------------  Urinalysis result obtained. Consistent with dehydration. Patient was given IV fluids during this ED visit. Not concerning for urinary tract infection. Discharge home with treatment for renal colic as per Dr. Theora GianottiBrown's plan.   Sharman CheekPhillip Chrstopher Malenfant, MD 01/20/17 [id number]

## 2017-01-20 NOTE — ED Triage Notes (Signed)
Pt c/o lower left abdomin pain that woke pt up this AM. Pt describes pain as intense, sharpe pain that nothing relieves. N/V with pain.

## 2017-01-20 NOTE — ED Provider Notes (Signed)
[facility] Emergency Department Provider Note   First MD Initiated Contact with Patient 01/20/17 [phone]     (approximate)  I have reviewed the triage vital signs and the nursing notes.   HISTORY  Chief Complaint Abdominal Pain   HPI Nathan Krause is a 46 y.o. male with blow list of medical conditions presents to the emergency department with acute onset of left flank/left lower quadrant abdominal pain that awoke the patient from sleep approximately one hour ago accompanied by nausea and vomiting. Patient denies any aggravating or alleviating factors to the pain. Patient denies any fever no dysuria or hematuria no diarrhea constipation.   Past Medical History:  Diagnosis Date  . Anxiety and depression   . Asthma   . DVT (deep venous thrombosis) (HCC)   . Essential hypertension   . Gout   . Hyperlipidemia   . Pulmonary embolism (HCC)   . Rheumatoid arthritis (HCC)   . Type 2 diabetes mellitus without complication [facility])     Patient Active Problem List   Diagnosis Date Noted  . Preventative health care 03/19/2016  . Type 2 diabetes mellitus without complication, without long-term current use of insulin (HCC) 08/03/2015  . Anxiety and depression 08/03/2015  . Hyperlipidemia 08/03/2015  . Essential hypertension 08/03/2015  . Gout 08/03/2015    Past Surgical History:  Procedure Laterality Date  . KNEE ARTHROSCOPY Right   . KNEE SURGERY Left     Prior to Admission medications   Medication Sig Start Date End Date Taking? Authorizing Provider  albuterol (PROVENTIL HFA;VENTOLIN HFA) 108 (90 BASE) MCG/ACT inhaler Inhale 2 puffs into the lungs as needed.  02/15/15   Historical Provider, MD  aspirin 81 MG tablet Take 81 mg by mouth daily.     Historical Provider, MD  citalopram (CELEXA) 20 MG tablet Take 1 tablet (20 mg total) by mouth daily. 09/22/16   Doreene Nest, NP  Glucosamine 500 MG CAPS Take 1,000 mg by mouth daily.     Historical Provider,  MD  HYDROcodone-acetaminophen (NORCO) 5-325 MG tablet Take 1 tablet by mouth every 6 (six) hours as needed for moderate pain or severe pain. 01/20/17   Sharman Cheek, MD  lisinopril (PRINIVIL,ZESTRIL) 10 MG tablet Take 1 tablet (10 mg total) by mouth daily. 09/24/16   Doreene Nest, NP  metFORMIN (GLUCOPHAGE) 1000 MG tablet Take 1 tablet (1,000 mg total) by mouth 2 (two) times daily with a meal. 03/19/16   Doreene Nest, NP  Multiple Vitamin (MULTIVITAMIN) capsule Take 1 capsule by mouth daily.     Historical Provider, MD  ondansetron (ZOFRAN ODT) 4 MG disintegrating tablet Take 1 tablet (4 mg total) by mouth every 8 (eight) hours as needed for nausea or vomiting. 01/20/17   Sharman Cheek, MD  simvastatin (ZOCOR) 20 MG tablet Take 1 tablet (20 mg total) by mouth daily at 6 PM. 09/24/16   Doreene Nest, NP  tadalafil (CIALIS) 5 MG tablet Take 10 mg by mouth daily as needed.     Historical Provider, MD  tamsulosin (FLOMAX) 0.4 MG CAPS capsule Take 1 capsule (0.4 mg total) by mouth daily after breakfast. 01/20/17   Darci Current, MD    Allergies Oxycodone-acetaminophen  Family History  Problem Relation Age of Onset  . Heart disease Father   . Heart attack Father 3    deceased  . Colon cancer Mother 22  . Breast cancer Mother     Social History Social History  Substance Use Topics  . Smoking status: Never Smoker  . Smokeless tobacco: Never Used  . Alcohol use 0.0 oz/week     Comment: social    Review of Systems Constitutional: No fever/chills Eyes: No visual changes. ENT: No sore throat. Cardiovascular: Denies chest pain. Respiratory: Denies shortness of breath. Gastrointestinal: Positive for abdominal pain and vomiting  Genitourinary: Negative for dysuria. Musculoskeletal: Negative for back pain. Skin: Negative for rash. Neurological: Negative for headaches, focal weakness or numbness.  10-point ROS otherwise  negative.  ____________________________________________   PHYSICAL EXAM:  VITAL SIGNS: ED Triage Vitals  Enc Vitals Group     BP 01/20/17 0518 (!) 138/96     Pulse Rate 01/20/17 0518 79     Resp 01/20/17 0518 17     Temp 01/20/17 0518 97.8 F (36.6 C)     Temp Source 01/20/17 0518 Oral     SpO2 01/20/17 0518 99 %     Weight 01/20/17 0518 (!) 307 lb (139.3 kg)     Height 01/20/17 0518 6\' 6"  (1.981 m)     Head Circumference --      Peak Flow --      Pain Score 01/20/17 0539 10     Pain Loc --      Pain Edu? --      Excl. in GC? --     Constitutional: Alert and oriented. Apparent discomfort Eyes: Conjunctivae are normal. PERRL. EOMI. Head: Atraumatic. Mouth/Throat: Mucous membranes are moist.  Oropharynx non-erythematous. Neck: No stridor.  Cardiovascular: Normal rate, regular rhythm. Good peripheral circulation. Grossly normal heart sounds. Respiratory: Normal respiratory effort.  No retractions. Lungs CTAB. Gastrointestinal: Soft and nontender. No distention.  Musculoskeletal: No lower extremity tenderness nor edema. No gross deformities of extremities. Neurologic:  Normal speech and language. No gross focal neurologic deficits are appreciated.  Skin:  Skin is warm, dry and intact. No rash noted. Psychiatric: Mood and affect are normal. Speech and behavior are normal.  ____________________________________________   LABS (all labs ordered are listed, but only abnormal results are displayed)  Labs Reviewed  COMPREHENSIVE METABOLIC PANEL - Abnormal; Notable for the following:       Result Value   Glucose, Bld 183 (*)    AST 150 (*)    ALT 79 (*)    All other components within normal limits  URINALYSIS, COMPLETE (UACMP) WITH MICROSCOPIC - Abnormal; Notable for the following:    Color, Urine YELLOW (*)    APPearance HAZY (*)    Specific Gravity, Urine >1.046 (*)    Hgb urine dipstick LARGE (*)    All other components within normal limits  LIPASE, BLOOD  CBC     RADIOLOGY I, Carlisle N BROWN, personally viewed and evaluated these images (plain radiographs) as part of my medical decision making, as well as reviewing the written report by the radiologist.  Ct Abdomen Pelvis W Contrast  Result Date: 01/20/2017 CLINICAL DATA:  Left lower quadrant abdominal pain.  Vomiting. EXAM: CT ABDOMEN AND PELVIS WITH CONTRAST TECHNIQUE: Multidetector CT imaging of the abdomen and pelvis was performed using the standard protocol following bolus administration of intravenous contrast. CONTRAST:  100mL ISOVUE-300 IOPAMIDOL (ISOVUE-300) INJECTION 61% COMPARISON:  None. FINDINGS: Lower chest: Tiny 3 mm subpleural nodule in the right lower lobe. Calcified granuloma in the left lower lobe. Lung bases are otherwise clear. Hepatobiliary: Decreased hepatic density consistent with steatosis. No focal hepatic lesion. Gallbladder physiologically distended, no calcified stone. No biliary dilatation. Pancreas: No ductal dilatation or inflammation.  Spleen: Tiny subcentimeter splenic hypodensities, nonspecific. Adrenals/Urinary Tract: Obstructing 3 mm stone in the left proximal ureter at the level of L2-L3 with mild hydronephrosis and perinephric edema. Absent renal excretion on delayed phase imaging. Additional nonobstructing nephrolithiasis in the lower left kidney. No right hydronephrosis urinary bladder is minimally distended. Normal adrenal glands. Stomach/Bowel: Stomach is within normal limits. Appendix appears normal. No evidence of bowel wall thickening, distention, or inflammatory changes. Vascular/Lymphatic: Minimal aortic atherosclerosis without aneurysm. No adenopathy. Reproductive: Prostate is unremarkable. Soft tissue density in the left inguinal canal, partially included, and may be inguinal testis. Other: No free air, free fluid, or intra-abdominal fluid collection. Tiny fat containing umbilical hernia. Musculoskeletal: Scattered pelvic bone islands. There are no acute or  suspicious osseous abnormalities. IMPRESSION: 1. Obstructing 3 mm stone in the left proximal ureter with mild hydronephrosis. Additional nonobstructing lower pole left renal calculi. 2. Mild hepatic steatosis. 3. Probable left inguinal testis. This may be transient, recommend correlation with physical exam. 4. Trace aortic atherosclerosis. 5. Calcified granuloma in the left lung base. Tiny 3 mm right lower lobe pulmonary nodule, likely sequela of prior granulomatous disease. No follow-up needed if patient is low-risk. Non-contrast chest CT can be considered in 12 months if patient is high-risk. This recommendation follows the consensus statement: Guidelines for Management of Incidental Pulmonary Nodules Detected on CT Images: From the Fleischner Society 2017; Radiology 2017; 284:228-243. Electronically Signed   By: Rubye Oaks M.D.   On: 01/20/2017 06:53     Procedures   ____________________________________________   INITIAL IMPRESSION / ASSESSMENT AND PLAN / ED COURSE  Pertinent labs & imaging results that were available during my care of the patient were reviewed by me and considered in my medical decision making (see chart for details).  Patient received IV morphine and Zofran on arrival with some improvement of pain however no resolution and a such morphine was repeated. After I viewed the patient's CT scan which revealed a kidney stone patient was given Toradol as well. Patient's care transferred to Dr. Scotty Court withUrinalysis pending     ____________________________________________  FINAL CLINICAL IMPRESSION(S) / ED DIAGNOSES  Final diagnoses:  Kidney stone     MEDICATIONS GIVEN DURING THIS VISIT:  Medications  morphine 4 MG/ML injection 4 mg (4 mg Intravenous Given 01/20/17 0534)  ondansetron (ZOFRAN) injection 4 mg (4 mg Intravenous Given 01/20/17 0534)  iopamidol (ISOVUE-300) 61 % injection 30 mL (30 mLs Oral Contrast Given 01/20/17 0539)  ondansetron (ZOFRAN) injection 4  mg (4 mg Intravenous Given 01/20/17 0606)  iopamidol (ISOVUE-300) 61 % injection 100 mL (100 mLs Intravenous Contrast Given 01/20/17 0631)  morphine 4 MG/ML injection 4 mg (4 mg Intravenous Given 01/20/17 0659)  ketorolac (TORADOL) 30 MG/ML injection 30 mg (30 mg Intravenous Given 01/20/17 0658)  sodium chloride 0.9 % bolus 1,000 mL (0 mLs Intravenous Stopped 01/20/17 0833)     NEW OUTPATIENT MEDICATIONS STARTED DURING THIS VISIT:  Discharge Medication List as of 01/20/2017  7:04 AM    START taking these medications   Details  tamsulosin (FLOMAX) 0.4 MG CAPS capsule Take 1 capsule (0.4 mg total) by mouth daily after breakfast., Starting Tue 01/20/2017, Print    ondansetron (ZOFRAN ODT) 4 MG disintegrating tablet Take 1 tablet (4 mg total) by mouth every 8 (eight) hours as needed for nausea or vomiting., Starting Tue 01/20/2017, Print    oxyCODONE-acetaminophen (ROXICET) 5-325 MG tablet Take 1 tablet by mouth every 4 (four) hours as needed for severe pain., Starting Tue 01/20/2017, Print  Discharge Medication List as of 01/20/2017  7:04 AM      Discharge Medication List as of 01/20/2017  7:04 AM       Note:  This document was prepared using Dragon voice recognition software and may include unintentional dictation errors.    Darci Current, MD 01/20/17 [phone]

## 2017-01-28 ENCOUNTER — Ambulatory Visit (INDEPENDENT_AMBULATORY_CARE_PROVIDER_SITE_OTHER): Payer: Medicare HMO | Admitting: Primary Care

## 2017-01-28 ENCOUNTER — Encounter: Payer: Self-pay | Admitting: Primary Care

## 2017-01-28 VITALS — BP 122/80 | HR 77 | Temp 97.7°F | Ht 78.0 in | Wt 309.8 lb

## 2017-01-28 DIAGNOSIS — N200 Calculus of kidney: Secondary | ICD-10-CM | POA: Diagnosis not present

## 2017-01-28 MED ORDER — TAMSULOSIN HCL 0.4 MG PO CAPS: 0.4000 mg | ORAL_CAPSULE | Freq: Every day | ORAL | 0 refills | Status: DC

## 2017-01-28 MED ORDER — HYDROCODONE-ACETAMINOPHEN 5-325 MG PO TABS: 1.0000 | ORAL_TABLET | Freq: Four times a day (QID) | ORAL | 0 refills | Status: DC | PRN

## 2017-01-28 NOTE — Progress Notes (Signed)
Pre visit review using our clinic review tool, if applicable. No additional management support is needed unless otherwise documented below in the visit note. 

## 2017-01-28 NOTE — Patient Instructions (Addendum)
Continue tamsulosin (Flomax) capsules once daily to help expel kidney stone.  You may take the Vicodin every 6 hours as needed for severe pain. Use Advil for mild to moderate pain.  Please call me in 1 week if no improvement.  We will see you soon for your physical! It was a pleasure to see you today!

## 2017-01-28 NOTE — Progress Notes (Signed)
Subjective:    Patient ID: Nathan Krause, male    DOB: [birth year], 46 y.o.   MRN: [id number]  HPI  Mr. Hair is a 46 year old male who presents today for emergency department follow up.   He presented to [facility] ED on 01/20/17 with a chief complaint of abdominal pain. His pain was located to the left flank/LLQ of abdomen that awoke patient from sleep on 01/20/17. He also experienced nausea and vomiting.  During his stay in the ED he underwent treatment with IV pain and anti-nausea medication. He underwent CT abdomen/pelvis which revealed 3 mm obstructive renal stone in the left proximal ureter with mild hydronephrosis. It was also noted the he had a 3 mm granuloma to right lower lobe. He is a non smoker.  He was prescribed Flomax, Zofran, and Percocet and discharged home later on 01/20/17. Overall he's doing better but continues to experience left flank pain. He's taking Vicodin sparingly and using Advil as well. He's using a strainer daily when urinating and has not seen evidence of stone expulsion. He denies hematuria, dysuria, fevers. He's had some nausea.  Review of Systems  Constitutional: Negative for fever.  Genitourinary: Positive for flank pain. Negative for dysuria and hematuria.       Past Medical History:  Diagnosis Date  . Anxiety and depression   . Asthma   . DVT (deep venous thrombosis) (HCC)   . Essential hypertension   . Gout   . Hyperlipidemia   . Pulmonary embolism (HCC)   . Rheumatoid arthritis (HCC)   . Type 2 diabetes mellitus without complication [facility])      Social History   Social History  . Marital status: Married    Spouse name: N/A  . Number of children: N/A  . Years of education: N/A   Occupational History  . Not on file.   Social History Main Topics  . Smoking status: Never Smoker  . Smokeless tobacco: Never Used  . Alcohol use 0.0 oz/week     Comment: social  . Drug use: Unknown  . Sexual activity: Not on file   Other Topics Concern  .  Not on file   Social History Narrative   Married.   2 children.   Disabled and does not work.   Enjoys watching movies, working out, spending time with dogs, traveling.       Past Surgical History:  Procedure Laterality Date  . KNEE ARTHROSCOPY Right   . KNEE SURGERY Left     Family History  Problem Relation Age of Onset  . Heart disease Father   . Heart attack Father 68    deceased  . Colon cancer Mother 21  . Breast cancer Mother     Allergies  Allergen Reactions  . Oxycodone-Acetaminophen Nausea Only    Current Outpatient Prescriptions on File Prior to Visit  Medication Sig Dispense Refill  . albuterol (PROVENTIL HFA;VENTOLIN HFA) 108 (90 BASE) MCG/ACT inhaler Inhale 2 puffs into the lungs as needed.     Marland Kitchen aspirin 81 MG tablet Take 81 mg by mouth daily.     . citalopram (CELEXA) 20 MG tablet Take 1 tablet (20 mg total) by mouth daily. 90 tablet 3  . Glucosamine 500 MG CAPS Take 1,000 mg by mouth daily.     Marland Kitchen lisinopril (PRINIVIL,ZESTRIL) 10 MG tablet Take 1 tablet (10 mg total) by mouth daily. 90 tablet 1  . metFORMIN (GLUCOPHAGE) 1000 MG tablet Take 1 tablet (1,000 mg total) by  mouth 2 (two) times daily with a meal. 180 tablet 3  . Multiple Vitamin (MULTIVITAMIN) capsule Take 1 capsule by mouth daily.     . ondansetron (ZOFRAN ODT) 4 MG disintegrating tablet Take 1 tablet (4 mg total) by mouth every 8 (eight) hours as needed for nausea or vomiting. 20 tablet 0  . simvastatin (ZOCOR) 20 MG tablet Take 1 tablet (20 mg total) by mouth daily at 6 PM. 90 tablet 2   No current facility-administered medications on file prior to visit.     BP 122/80   Pulse 77   Temp 97.7 F (36.5 C) (Oral)   Ht  (1.981 m)   Wt (!) 309 lb 12.8 oz (140.5 kg)   SpO2 96%   BMI 35.80 kg/m    Objective:   Physical Exam  Constitutional: He appears well-nourished.  Neck: Neck supple.  Cardiovascular: Normal rate and regular rhythm.   Pulmonary/Chest: Effort normal and breath  sounds normal.  Abdominal: There is no CVA tenderness.  Skin: Skin is warm and dry.          Assessment & Plan:  Renal Stone:  Presented to ED with left flank pain. 3 mm left ureteral stone found via CT scan. Overall doing better, but still experiencing pain. Will refill Flomax as he's completed his last dose yesterday. Refill provided for short term Vicodin to use PRN severe pain. He will use Advil for moderate pain as this is working well. Continue to use strainer with urination.  He will follow up if no improvement in 1 week. Discussed strict return precautions.  All hospital notes, labs, imaging reviewed.  Morrie Sheldon, NP

## 2017-02-05 LAB — HM DIABETES EYE EXAM

## 2017-02-12 ENCOUNTER — Encounter: Payer: Self-pay | Admitting: Primary Care

## 2017-03-24 ENCOUNTER — Encounter: Payer: Self-pay | Admitting: Primary Care

## 2017-03-24 ENCOUNTER — Ambulatory Visit (INDEPENDENT_AMBULATORY_CARE_PROVIDER_SITE_OTHER): Payer: Medicare HMO | Admitting: Primary Care

## 2017-03-24 ENCOUNTER — Ambulatory Visit: Payer: Medicare HMO | Admitting: Primary Care

## 2017-03-24 VITALS — BP 124/84 | HR 76 | Temp 97.7°F | Ht 78.0 in | Wt 307.4 lb

## 2017-03-24 DIAGNOSIS — R7989 Other specified abnormal findings of blood chemistry: Secondary | ICD-10-CM | POA: Diagnosis not present

## 2017-03-24 DIAGNOSIS — R945 Abnormal results of liver function studies: Secondary | ICD-10-CM

## 2017-03-24 DIAGNOSIS — Z23 Encounter for immunization: Secondary | ICD-10-CM | POA: Diagnosis not present

## 2017-03-24 DIAGNOSIS — I10 Essential (primary) hypertension: Secondary | ICD-10-CM | POA: Diagnosis not present

## 2017-03-24 DIAGNOSIS — E785 Hyperlipidemia, unspecified: Secondary | ICD-10-CM

## 2017-03-24 DIAGNOSIS — E119 Type 2 diabetes mellitus without complications: Secondary | ICD-10-CM | POA: Diagnosis not present

## 2017-03-24 DIAGNOSIS — F419 Anxiety disorder, unspecified: Secondary | ICD-10-CM | POA: Diagnosis not present

## 2017-03-24 DIAGNOSIS — Z0000 Encounter for general adult medical examination without abnormal findings: Secondary | ICD-10-CM

## 2017-03-24 DIAGNOSIS — F329 Major depressive disorder, single episode, unspecified: Secondary | ICD-10-CM | POA: Diagnosis not present

## 2017-03-24 DIAGNOSIS — M1A9XX0 Chronic gout, unspecified, without tophus (tophi): Secondary | ICD-10-CM | POA: Diagnosis not present

## 2017-03-24 DIAGNOSIS — F32A Depression, unspecified: Secondary | ICD-10-CM

## 2017-03-24 LAB — HEPATIC FUNCTION PANEL
ALK PHOS: 48 U/L (ref 39–117)
ALT: 34 U/L (ref 0–53)
AST: 20 U/L (ref 0–37)
Albumin: 4.3 g/dL (ref 3.5–5.2)
BILIRUBIN DIRECT: 0.1 mg/dL (ref 0.0–0.3)
BILIRUBIN TOTAL: 0.5 mg/dL (ref 0.2–1.2)
Total Protein: 7.2 g/dL (ref 6.0–8.3)

## 2017-03-24 LAB — LIPID PANEL
Cholesterol: 150 mg/dL (ref 0–200)
HDL: 35.8 mg/dL — ABNORMAL LOW (ref >39.00)
LDL Cholesterol: 81 mg/dL (ref 0–99)
NONHDL: 113.99
Total CHOL/HDL Ratio: 4
Triglycerides: 167 mg/dL — ABNORMAL HIGH (ref 0.0–149.0)
VLDL: 33.4 mg/dL (ref 0.0–40.0)

## 2017-03-24 LAB — HEMOGLOBIN A1C: Hgb A1c MFr Bld: 6.8 % — ABNORMAL HIGH (ref 4.6–6.5)

## 2017-03-24 NOTE — Assessment & Plan Note (Signed)
Doing well on Celexa, continue same. PHQ-2 score of 0 today.

## 2017-03-24 NOTE — Addendum Note (Signed)
Addended by: Tawnya CrookSAMBATH, Dajah Fischman on: 03/24/2017 03:50 PM   Modules accepted: Orders

## 2017-03-24 NOTE — Assessment & Plan Note (Addendum)
Stable in the office today, continue lisinopril 10 mg. ECG today with NSR, rate of 72. No ST abnormality.

## 2017-03-24 NOTE — Assessment & Plan Note (Signed)
No recent attacks

## 2017-03-24 NOTE — Assessment & Plan Note (Addendum)
Due for A1C today. Managed on statin and ACE. Pneumonia vaccination UTD. Foot exam today unremarkable.  Discouraged consumption of fast food, sugary drinks. Follow up in 6 months.

## 2017-03-24 NOTE — Patient Instructions (Signed)
Complete lab work prior to leaving today. I will notify you of your results once received.   You received a tetanus vaccination which will cover you for 10 years.  Your ECG looks good.  It's important to improve your diet by reducing consumption of fast food, fried food, processed snack foods, sugary drinks. Increase consumption of fresh vegetables and fruits, whole grains, water.  Ensure you are drinking 64 ounces of water daily.  Continue exercising. You should be getting 150 minutes of moderate intensity exercise weekly.  Follow up in 6 months for follow up or sooner if needed.  It was a pleasure to see you today!

## 2017-03-24 NOTE — Assessment & Plan Note (Signed)
Td due, provided today. Pneumonia vaccination UTD. ECG completed today given family history of MI in his father at his same age. Discussed the importance of a healthy diet and regular exercise in order for weight loss, and to reduce the risk of other medical diseases. Exam unremarkable. Labs pending. Follow up in 1 year for annual exam.

## 2017-03-24 NOTE — Assessment & Plan Note (Signed)
Due for repeat lipids today.  Continue statin.

## 2017-03-24 NOTE — Progress Notes (Signed)
Subjective:    Patient ID: Nathan Krause, male    DOB: 19-Aug-1971, 46 y.o.   MRN: [id number]  HPI  Mr. Grisby is a 46 year old male who presents today for complete physical.  Immunizations: -Tetanus: Completed in 2008 -Influenza: Did not complete last season -Pneumonia: Completed Pneumovax in 2016   Diet: He endorses a fair diet. Breakfast: Oatmeal, cereal, occasionally english muffin Lunch: Sandwich, pasta salad Dinner: Fast food (1-2 nights weekly), meat, vegetables, starch Snacks: Pretzels, chips Desserts: Occasionally Beverages: Water, occasional sweet tea and soda  Exercise: He does not routinely exercise, sometimes gym 1-2 times weekly. Eye exam: Completed in April 2018 Dental exam: Has not completed recenty   Review of Systems  Constitutional: Negative for unexpected weight change.  HENT: Negative for rhinorrhea.   Respiratory: Negative for cough and shortness of breath.   Cardiovascular: Negative for chest pain.  Gastrointestinal: Negative for constipation and diarrhea.  Genitourinary: Negative for difficulty urinating and hematuria.  Musculoskeletal: Negative for arthralgias and myalgias.  Skin: Negative for rash.  Allergic/Immunologic: Negative for environmental allergies.  Neurological: Negative for dizziness, numbness and headaches.  Psychiatric/Behavioral:       Doing well on Celexa       Past Medical History:  Diagnosis Date  . Anxiety and depression   . Asthma   . DVT (deep venous thrombosis) (HCC)   . Essential hypertension   . Gout   . Hyperlipidemia   . Pulmonary embolism (HCC)   . Rheumatoid arthritis (HCC)   . Type 2 diabetes mellitus without complication [facility])      Social History   Social History  . Marital status: Married    Spouse name: N/A  . Number of children: N/A  . Years of education: N/A   Occupational History  . Not on file.   Social History Main Topics  . Smoking status: Never Smoker  . Smokeless tobacco: Never Used    . Alcohol use 0.0 oz/week     Comment: social  . Drug use: Unknown  . Sexual activity: Not on file   Other Topics Concern  . Not on file   Social History Narrative   Married.   2 children.   Disabled and does not work.   Enjoys watching movies, working out, spending time with dogs, traveling.       Past Surgical History:  Procedure Laterality Date  . KNEE ARTHROSCOPY Right   . KNEE SURGERY Left     Family History  Problem Relation Age of Onset  . Heart disease Father   . Heart attack Father 73       deceased  . Colon cancer Mother 45  . Breast cancer Mother     Allergies  Allergen Reactions  . Oxycodone-Acetaminophen Nausea Only    Current Outpatient Prescriptions on File Prior to Visit  Medication Sig Dispense Refill  . albuterol (PROVENTIL HFA;VENTOLIN HFA) 108 (90 BASE) MCG/ACT inhaler Inhale 2 puffs into the lungs as needed.     Marland Kitchen aspirin 81 MG tablet Take 81 mg by mouth daily.     . citalopram (CELEXA) 20 MG tablet Take 1 tablet (20 mg total) by mouth daily. 90 tablet 3  . Glucosamine 500 MG CAPS Take 1,000 mg by mouth daily.     Marland Kitchen lisinopril (PRINIVIL,ZESTRIL) 10 MG tablet Take 1 tablet (10 mg total) by mouth daily. 90 tablet 1  . metFORMIN (GLUCOPHAGE) 1000 MG tablet Take 1 tablet (1,000 mg total) by mouth 2 (two)  times daily with a meal. 180 tablet 3  . Multiple Vitamin (MULTIVITAMIN) capsule Take 1 capsule by mouth daily.     . simvastatin (ZOCOR) 20 MG tablet Take 1 tablet (20 mg total) by mouth daily at 6 PM. 90 tablet 2   No current facility-administered medications on file prior to visit.     BP 124/84   Pulse 76   Temp 97.7 F (36.5 C) (Oral)   Ht 6\' 6"  (1.981 m)   Wt (!) 307 lb 6.4 oz (139.4 kg)   SpO2 97%   BMI 35.52 kg/m    Objective:   Physical Exam  Constitutional: He is oriented to person, place, and time. He appears well-nourished.  HENT:  Right Ear: Tympanic membrane and ear canal normal.  Left Ear: Tympanic membrane and ear  canal normal.  Nose: Nose normal. Right sinus exhibits no maxillary sinus tenderness and no frontal sinus tenderness. Left sinus exhibits no maxillary sinus tenderness and no frontal sinus tenderness.  Mouth/Throat: Oropharynx is clear and moist.  Eyes: Conjunctivae and EOM are normal. Pupils are equal, round, and reactive to light.  Neck: Neck supple. Carotid bruit is not present. No thyromegaly present.  Cardiovascular: Normal rate, regular rhythm and normal heart sounds.   Pulmonary/Chest: Effort normal and breath sounds normal. He has no wheezes. He has no rales.  Abdominal: Soft. Bowel sounds are normal. There is no tenderness.  Musculoskeletal: Normal range of motion.  Neurological: He is alert and oriented to person, place, and time. He has normal reflexes. No cranial nerve deficit.  Skin: Skin is warm and dry.  Psychiatric: He has a normal mood and affect.          Assessment & Plan:

## 2017-04-13 ENCOUNTER — Other Ambulatory Visit: Payer: Self-pay | Admitting: Primary Care

## 2017-04-13 DIAGNOSIS — I10 Essential (primary) hypertension: Secondary | ICD-10-CM

## 2017-08-31 ENCOUNTER — Other Ambulatory Visit: Payer: Self-pay | Admitting: Primary Care

## 2017-08-31 DIAGNOSIS — F419 Anxiety disorder, unspecified: Secondary | ICD-10-CM

## 2017-08-31 DIAGNOSIS — I10 Essential (primary) hypertension: Secondary | ICD-10-CM

## 2017-08-31 DIAGNOSIS — F32A Depression, unspecified: Secondary | ICD-10-CM

## 2017-08-31 DIAGNOSIS — IMO0001 Reserved for inherently not codable concepts without codable children: Secondary | ICD-10-CM

## 2017-08-31 DIAGNOSIS — F329 Major depressive disorder, single episode, unspecified: Secondary | ICD-10-CM

## 2017-08-31 DIAGNOSIS — E1165 Type 2 diabetes mellitus with hyperglycemia: Secondary | ICD-10-CM

## 2017-08-31 DIAGNOSIS — E785 Hyperlipidemia, unspecified: Secondary | ICD-10-CM

## 2017-09-14 ENCOUNTER — Telehealth: Payer: Self-pay | Admitting: *Deleted

## 2017-09-14 NOTE — Telephone Encounter (Signed)
Placed form in Kate's inbox 

## 2017-09-14 NOTE — Telephone Encounter (Signed)
Noted, completed placard and placed in Chan's inbox.

## 2017-09-14 NOTE — Telephone Encounter (Signed)
Patient stated that the reason is because he is limited to walk due to his feet (orthopedic issue) and arthritis.

## 2017-09-14 NOTE — Telephone Encounter (Signed)
Copied from CRM [phone]. Topic: General - Other >> Sep 14, 2017 12:10 PM Theresia LoBooth, Wanda L wrote: Reason for CRM: Form filled out  >> Sep 14, 2017 12:12 PM Theresia LoBooth, Wanda L wrote: Pt dropped off form to be filled out- Call when ready for pick up -- Placed in RX folder in front office

## 2017-09-14 NOTE — Telephone Encounter (Signed)
Please kindly ask patient his symptoms and reason for the handicap placard. I'm happy to help, but need more details.

## 2017-09-15 NOTE — Telephone Encounter (Signed)
Patient was notified that form is ready for pick up left in the front office.

## 2017-09-24 ENCOUNTER — Ambulatory Visit: Payer: Medicare HMO | Admitting: Primary Care

## 2017-09-24 ENCOUNTER — Encounter: Payer: Self-pay | Admitting: Primary Care

## 2017-09-24 ENCOUNTER — Encounter (INDEPENDENT_AMBULATORY_CARE_PROVIDER_SITE_OTHER): Payer: Self-pay

## 2017-09-24 VITALS — BP 122/80 | HR 68 | Temp 98.5°F | Ht 78.0 in | Wt 314.4 lb

## 2017-09-24 DIAGNOSIS — E119 Type 2 diabetes mellitus without complications: Secondary | ICD-10-CM

## 2017-09-24 LAB — HEMOGLOBIN A1C: Hgb A1c MFr Bld: 7.2 % — ABNORMAL HIGH (ref 4.6–6.5)

## 2017-09-24 NOTE — Assessment & Plan Note (Signed)
Due for repeat A1C. Overall fair diet, some exercise. Recommended to increase exercise and eat out less.  Foot exam, pneumonia vaccination, eye exam UTD. Managed on statin and ACE. Follow up in 1 year.

## 2017-09-24 NOTE — Progress Notes (Signed)
Subjective:    Patient ID: Nathan Krause, male    DOB: 11-09-1970, 46 y.o.   MRN: [id number]  HPI  Nathan Krause is a 46 year old male who presents today for follow up of diabetes.  1) Type 2 Diabetes: He denies chest pain, dizziness, numbness/tingling, weakness.   Current medications include: Metformin 1000 mg BID.   Last A1C: 6.8 in May 2018. Last Eye Exam: Completed in April 2018 Last Foot Exam: Completed in May 2018 Pneumonia Vaccination: Completed in 2016 ACE/ARB: ACE Statin: Simvastatin  Diet currently consists of:  Breakfast: Homemade granola, fruit Lunch: Skips sometimes, sandwich, occasional fast food Dinner: [employer], tries to make healthier choices. Meat, vegetables, sometimes french fries. Snacks: Cheese it's, popcorn Desserts: 1-2 times weekly Beverages: Water, occasional juice, sweet tea with dinner made with Splenda  Exercise: He is walking 2-3 days weekly for 45 minutes       Review of Systems  Constitutional: Negative for fatigue.  Respiratory: Negative for shortness of breath.   Cardiovascular: Negative for chest pain.  Neurological: Negative for dizziness, weakness and headaches.       Past Medical History:  Diagnosis Date  . Anxiety and depression   . Asthma   . DVT (deep venous thrombosis) (HCC)   . Essential hypertension   . Gout   . Hyperlipidemia   . Pulmonary embolism (HCC)   . Rheumatoid arthritis (HCC)   . Type 2 diabetes mellitus without complication (HCC)      Social History   Socioeconomic History  . Marital status: Married    Spouse name: Not on file  . Number of children: Not on file  . Years of education: Not on file  . Highest education level: Not on file  Social Needs  . Financial resource strain: Not on file  . Food insecurity - worry: Not on file  . Food insecurity - inability: Not on file  . Transportation needs - medical: Not on file  . Transportation needs - non-medical: Not on file  Occupational History    . Not on file  Tobacco Use  . Smoking status: Never Smoker  . Smokeless tobacco: Never Used  Substance and Sexual Activity  . Alcohol use: Yes    Alcohol/week: 0.0 oz    Comment: social  . Drug use: Not on file  . Sexual activity: Not on file  Other Topics Concern  . Not on file  Social History Narrative   Married.   2 children.   Disabled and does not work.   Enjoys watching movies, working out, spending time with dogs, traveling.    Past Surgical History:  Procedure Laterality Date  . KNEE ARTHROSCOPY Right   . KNEE SURGERY Left     Family History  Problem Relation Age of Onset  . Heart disease Father   . Heart attack Father 246       deceased  . Colon cancer Mother [id number]  . Breast cancer Mother     Allergies  Allergen Reactions  . Oxycodone-Acetaminophen Nausea Only    Current Outpatient Medications on File Prior to Visit  Medication Sig Dispense Refill  . albuterol (PROVENTIL HFA;VENTOLIN HFA) 108 (90 BASE) MCG/ACT inhaler Inhale 2 puffs into the lungs as needed.     Marland Kitchen. aspirin 81 MG tablet Take 81 mg by mouth daily.     . citalopram (CELEXA) 20 MG tablet TAKE 1 TABLET (20 MG TOTAL) BY MOUTH DAILY. 90 tablet 1  . Glucosamine 500 MG CAPS  Take 1,000 mg by mouth daily.     Marland Kitchen. lisinopril (PRINIVIL,ZESTRIL) 10 MG tablet TAKE 1 TABLET EVERY DAY 90 tablet 1  . metFORMIN (GLUCOPHAGE) 1000 MG tablet TAKE 1 TABLET (1,000 MG TOTAL) BY MOUTH 2 (TWO) TIMES DAILY WITH MEALS. 180 tablet 1  . Multiple Vitamin (MULTIVITAMIN) capsule Take 1 capsule by mouth daily.     . simvastatin (ZOCOR) 20 MG tablet TAKE 1 TABLET (20 MG TOTAL) BY MOUTH DAILY AT 6 PM. 90 tablet 1   No current facility-administered medications on file prior to visit.     BP 122/80   Pulse 68   Temp 98.5 F (36.9 C) (Oral)   Ht 6\' 6"  (1.981 m)   Wt (!) 314 lb 6.4 oz (142.6 kg)   SpO2 97%   BMI 36.33 kg/m    Objective:   Physical Exam  Constitutional: He appears well-nourished.  Neck: Neck supple.   Cardiovascular: Normal rate and regular rhythm.  Pulmonary/Chest: Effort normal and breath sounds normal.  Skin: Skin is warm and dry.          Assessment & Plan:

## 2017-09-24 NOTE — Patient Instructions (Signed)
Complete lab work prior to leaving today. I will notify you of your results once received.   Continue exercising. You should be getting 150 minutes of moderate intensity exercise weekly.  Increase consumption of vegetables, fruit, whole grains.  Ensure you are consuming 64 ounces of water daily.  Please schedule a physical with me in 6 months. You may also schedule a lab only appointment 3-4 days prior. We will discuss your lab results in detail during your physical.  It was a pleasure to see you today!

## 2017-09-28 ENCOUNTER — Telehealth: Payer: Self-pay | Admitting: Primary Care

## 2017-09-28 NOTE — Telephone Encounter (Signed)
Pt given test results and recommendations per notes of K. Clark,NP on 11/29. Pt verbalized understanding. No other concerns voiced at this time.

## 2017-09-30 ENCOUNTER — Encounter: Payer: Self-pay | Admitting: *Deleted

## 2017-09-30 ENCOUNTER — Encounter (INDEPENDENT_AMBULATORY_CARE_PROVIDER_SITE_OTHER): Payer: Self-pay

## 2018-01-21 ENCOUNTER — Other Ambulatory Visit: Payer: Self-pay | Admitting: Primary Care

## 2018-01-21 DIAGNOSIS — E785 Hyperlipidemia, unspecified: Secondary | ICD-10-CM

## 2018-01-21 DIAGNOSIS — F329 Major depressive disorder, single episode, unspecified: Secondary | ICD-10-CM

## 2018-01-21 DIAGNOSIS — I10 Essential (primary) hypertension: Secondary | ICD-10-CM

## 2018-01-21 DIAGNOSIS — F32A Depression, unspecified: Secondary | ICD-10-CM

## 2018-01-21 DIAGNOSIS — F419 Anxiety disorder, unspecified: Principal | ICD-10-CM

## 2018-01-21 DIAGNOSIS — E1165 Type 2 diabetes mellitus with hyperglycemia: Secondary | ICD-10-CM

## 2018-01-21 DIAGNOSIS — IMO0001 Reserved for inherently not codable concepts without codable children: Secondary | ICD-10-CM

## 2018-03-08 ENCOUNTER — Other Ambulatory Visit: Payer: Self-pay | Admitting: Primary Care

## 2018-03-08 DIAGNOSIS — E119 Type 2 diabetes mellitus without complications: Secondary | ICD-10-CM

## 2018-03-08 DIAGNOSIS — M1A49X0 Other secondary chronic gout, multiple sites, without tophus (tophi): Secondary | ICD-10-CM

## 2018-03-08 DIAGNOSIS — E785 Hyperlipidemia, unspecified: Secondary | ICD-10-CM

## 2018-03-17 ENCOUNTER — Other Ambulatory Visit (INDEPENDENT_AMBULATORY_CARE_PROVIDER_SITE_OTHER): Payer: Medicare HMO

## 2018-03-17 DIAGNOSIS — E119 Type 2 diabetes mellitus without complications: Secondary | ICD-10-CM | POA: Diagnosis not present

## 2018-03-17 DIAGNOSIS — M1A49X0 Other secondary chronic gout, multiple sites, without tophus (tophi): Secondary | ICD-10-CM | POA: Diagnosis not present

## 2018-03-17 DIAGNOSIS — E785 Hyperlipidemia, unspecified: Secondary | ICD-10-CM

## 2018-03-17 LAB — LIPID PANEL
CHOLESTEROL: 168 mg/dL (ref 0–200)
HDL: 37.2 mg/dL — ABNORMAL LOW (ref >39.00)
NonHDL: 130.6
Total CHOL/HDL Ratio: 5
Triglycerides: 214 mg/dL — ABNORMAL HIGH (ref 0.0–149.0)
VLDL: 42.8 mg/dL — ABNORMAL HIGH (ref 0.0–40.0)

## 2018-03-17 LAB — COMPREHENSIVE METABOLIC PANEL
ALBUMIN: 4.3 g/dL (ref 3.5–5.2)
ALT: 39 U/L (ref 0–53)
AST: 23 U/L (ref 0–37)
Alkaline Phosphatase: 55 U/L (ref 39–117)
BUN: 13 mg/dL (ref 6–23)
CO2: 29 meq/L (ref 19–32)
CREATININE: 1.2 mg/dL (ref 0.40–1.50)
Calcium: 9.5 mg/dL (ref 8.4–10.5)
Chloride: 105 mEq/L (ref 96–112)
GFR: 68.88 mL/min (ref >60.00)
Glucose, Bld: 124 mg/dL — ABNORMAL HIGH (ref 70–99)
Potassium: 4 mEq/L (ref 3.5–5.1)
SODIUM: 142 meq/L (ref 135–145)
Total Bilirubin: 0.4 mg/dL (ref 0.2–1.2)
Total Protein: 7.4 g/dL (ref 6.0–8.3)

## 2018-03-17 LAB — HEMOGLOBIN A1C: Hgb A1c MFr Bld: 7.8 % — ABNORMAL HIGH (ref 4.6–6.5)

## 2018-03-17 LAB — LDL CHOLESTEROL, DIRECT: LDL DIRECT: 114 mg/dL

## 2018-03-17 LAB — URIC ACID: Uric Acid, Serum: 8.6 mg/dL — ABNORMAL HIGH (ref 4.0–7.8)

## 2018-03-24 ENCOUNTER — Encounter: Payer: Self-pay | Admitting: Primary Care

## 2018-03-24 ENCOUNTER — Ambulatory Visit (INDEPENDENT_AMBULATORY_CARE_PROVIDER_SITE_OTHER): Payer: Medicare HMO | Admitting: Primary Care

## 2018-03-24 VITALS — BP 118/76 | HR 85 | Temp 98.2°F | Ht 78.0 in | Wt 321.2 lb

## 2018-03-24 DIAGNOSIS — E785 Hyperlipidemia, unspecified: Secondary | ICD-10-CM | POA: Diagnosis not present

## 2018-03-24 DIAGNOSIS — Z0000 Encounter for general adult medical examination without abnormal findings: Secondary | ICD-10-CM

## 2018-03-24 DIAGNOSIS — IMO0001 Reserved for inherently not codable concepts without codable children: Secondary | ICD-10-CM

## 2018-03-24 DIAGNOSIS — I10 Essential (primary) hypertension: Secondary | ICD-10-CM

## 2018-03-24 DIAGNOSIS — E1165 Type 2 diabetes mellitus with hyperglycemia: Secondary | ICD-10-CM

## 2018-03-24 DIAGNOSIS — F419 Anxiety disorder, unspecified: Secondary | ICD-10-CM | POA: Diagnosis not present

## 2018-03-24 DIAGNOSIS — F329 Major depressive disorder, single episode, unspecified: Secondary | ICD-10-CM

## 2018-03-24 DIAGNOSIS — M1A9XX0 Chronic gout, unspecified, without tophus (tophi): Secondary | ICD-10-CM | POA: Diagnosis not present

## 2018-03-24 DIAGNOSIS — E119 Type 2 diabetes mellitus without complications: Secondary | ICD-10-CM

## 2018-03-24 DIAGNOSIS — J4520 Mild intermittent asthma, uncomplicated: Secondary | ICD-10-CM | POA: Diagnosis not present

## 2018-03-24 DIAGNOSIS — F32A Depression, unspecified: Secondary | ICD-10-CM

## 2018-03-24 MED ORDER — ALBUTEROL SULFATE HFA 108 (90 BASE) MCG/ACT IN AERS: 2.0000 | INHALATION_SPRAY | RESPIRATORY_TRACT | 0 refills | Status: DC | PRN

## 2018-03-24 MED ORDER — SIMVASTATIN 40 MG PO TABS: ORAL_TABLET | ORAL | 3 refills | Status: DC

## 2018-03-24 MED ORDER — LISINOPRIL 10 MG PO TABS: ORAL_TABLET | ORAL | 3 refills | Status: DC

## 2018-03-24 MED ORDER — CITALOPRAM HYDROBROMIDE 20 MG PO TABS: 20.0000 mg | ORAL_TABLET | Freq: Every day | ORAL | 3 refills | Status: DC

## 2018-03-24 MED ORDER — METFORMIN HCL 1000 MG PO TABS: ORAL_TABLET | ORAL | 3 refills | Status: DC

## 2018-03-24 NOTE — Patient Instructions (Addendum)
It is important that you improve your diet. Please limit carbohydrates in the form of white bread, rice, pasta, sweets, fast food, fried food, sugary drinks, etc. Increase your consumption of fresh fruits and vegetables, whole grains, lean protein.  Ensure you are consuming 64 ounces of water daily.  Start exercising. You should be getting 150 minutes of moderate intensity exercise weekly.  Schedule your eye exam as discussed.  We've increased the dose of your Simvastatin from 20 mg to 40 mg. You may take two of the 20 mg tablets to equal 40 mg until your bottle is empty.  Schedule a lab only appointment in 3 months to check cholesterol and A1C.  Please schedule a follow up appointment in 6 months for diabetes check.   It was a pleasure to see you today!   Diabetes Mellitus and Nutrition When you have diabetes (diabetes mellitus), it is very important to have healthy eating habits because your blood sugar (glucose) levels are greatly affected by what you eat and drink. Eating healthy foods in the appropriate amounts, at about the same times every day, can help you:  Control your blood glucose.  Lower your risk of heart disease.  Improve your blood pressure.  Reach or maintain a healthy weight.  Every person with diabetes is different, and each person has different needs for a meal plan. Your health care provider may recommend that you work with a diet and nutrition specialist (dietitian) to make a meal plan that is best for you. Your meal plan may vary depending on factors such as:  The calories you need.  The medicines you take.  Your weight.  Your blood glucose, blood pressure, and cholesterol levels.  Your activity level.  Other health conditions you have, such as heart or kidney disease.  How do carbohydrates affect me? Carbohydrates affect your blood glucose level more than any other type of food. Eating carbohydrates naturally increases the amount of glucose in your  blood. Carbohydrate counting is a method for keeping track of how many carbohydrates you eat. Counting carbohydrates is important to keep your blood glucose at a healthy level, especially if you use insulin or take certain oral diabetes medicines. It is important to know how many carbohydrates you can safely have in each meal. This is different for every person. Your dietitian can help you calculate how many carbohydrates you should have at each meal and for snack. Foods that contain carbohydrates include:  Bread, cereal, rice, pasta, and crackers.  Potatoes and corn.  Peas, beans, and lentils.  Milk and yogurt.  Fruit and juice.  Desserts, such as cakes, cookies, ice cream, and candy.  How does alcohol affect me? Alcohol can cause a sudden decrease in blood glucose (hypoglycemia), especially if you use insulin or take certain oral diabetes medicines. Hypoglycemia can be a life-threatening condition. Symptoms of hypoglycemia (sleepiness, dizziness, and confusion) are similar to symptoms of having too much alcohol. If your health care provider says that alcohol is safe for you, follow these guidelines:  Limit alcohol intake to no more than 1 drink per day for nonpregnant women and 2 drinks per day for men. One drink equals 12 oz of beer, 5 oz of wine, or 1 oz of hard liquor.  Do not drink on an empty stomach.  Keep yourself hydrated with water, diet soda, or unsweetened iced tea.  Keep in mind that regular soda, juice, and other mixers may contain a lot of sugar and must be counted as carbohydrates.  What are tips for following this plan? Reading food labels  Start by checking the serving size on the label. The amount of calories, carbohydrates, fats, and other nutrients listed on the label are based on one serving of the food. Many foods contain more than one serving per package.  Check the total grams (g) of carbohydrates in one serving. You can calculate the number of servings of  carbohydrates in one serving by dividing the total carbohydrates by 15. For example, if a food has 30 g of total carbohydrates, it would be equal to 2 servings of carbohydrates.  Check the number of grams (g) of saturated and trans fats in one serving. Choose foods that have low or no amount of these fats.  Check the number of milligrams (mg) of sodium in one serving. Most people should limit total sodium intake to less than 2,300 mg per day.  Always check the nutrition information of foods labeled as "low-fat" or "nonfat". These foods may be higher in added sugar or refined carbohydrates and should be avoided.  Talk to your dietitian to identify your daily goals for nutrients listed on the label. Shopping  Avoid buying canned, premade, or processed foods. These foods tend to be high in fat, sodium, and added sugar.  Shop around the outside edge of the grocery store. This includes fresh fruits and vegetables, bulk grains, fresh meats, and fresh dairy. Cooking  Use low-heat cooking methods, such as baking, instead of high-heat cooking methods like deep frying.  Cook using healthy oils, such as olive, canola, or sunflower oil.  Avoid cooking with butter, cream, or high-fat meats. Meal planning  Eat meals and snacks regularly, preferably at the same times every day. Avoid going long periods of time without eating.  Eat foods high in fiber, such as fresh fruits, vegetables, beans, and whole grains. Talk to your dietitian about how many servings of carbohydrates you can eat at each meal.  Eat 4-6 ounces of lean protein each day, such as lean meat, chicken, fish, eggs, or tofu. 1 ounce is equal to 1 ounce of meat, chicken, or fish, 1 egg, or 1/4 cup of tofu.  Eat some foods each day that contain healthy fats, such as avocado, nuts, seeds, and fish. Lifestyle   Check your blood glucose regularly.  Exercise at least 30 minutes 5 or more days each week, or as told by your health care  provider.  Take medicines as told by your health care provider.  Do not use any products that contain nicotine or tobacco, such as cigarettes and e-cigarettes. If you need help quitting, ask your health care provider.  Work with a Veterinary surgeon or diabetes educator to identify strategies to manage stress and any emotional and social challenges. What are some questions to ask my health care provider?  Do I need to meet with a diabetes educator?  Do I need to meet with a dietitian?  What number can I call if I have questions?  When are the best times to check my blood glucose? Where to find more information:  American Diabetes Association: diabetes.org/food-and-fitness/food  Academy of Nutrition and Dietetics: https://www.vargas.com/  [employer] of Diabetes and Digestive and Kidney Diseases (NIH): FindJewelers.cz Summary  A healthy meal plan will help you control your blood glucose and maintain a healthy lifestyle.  Working with a diet and nutrition specialist (dietitian) can help you make a meal plan that is best for you.  Keep in mind that carbohydrates and alcohol have immediate  effects on your blood glucose levels. It is important to count carbohydrates and to use alcohol carefully. This information is not intended to replace advice given to you by your health care provider. Make sure you discuss any questions you have with your health care provider. Document Released: 07/10/2005 Document Revised: 11/17/2016 Document Reviewed: 11/17/2016 Elsevier Interactive Patient Education  [employer].

## 2018-03-24 NOTE — Assessment & Plan Note (Signed)
Gradual increase in A1C over the last 1-2 years. Would like to see him below 7, discussed this with patient today. He would like a chance to work on diet and exercise, this seems reasonable. Will repeat A1C in 3 months, if above goal, consider adding in Glipizide.  Managed on ACE and statin. Foot exam completed today. Discussed to schedule eye exam. Pneumonia vaccination UTD.  Follow up in 6 months.

## 2018-03-24 NOTE — Assessment & Plan Note (Signed)
LDL above goal of 100. Increase simvastatin to 40 mg. Repeat lipids in 3 months.   Discussed the importance of a healthy diet and regular exercise in order for weight loss, and to reduce the risk of any potential medical problems.

## 2018-03-24 NOTE — Assessment & Plan Note (Signed)
No recent flares. Continue to monitor. °

## 2018-03-24 NOTE — Assessment & Plan Note (Signed)
Doing well on Celexa, continue same. Denies SI/HI. 

## 2018-03-24 NOTE — Assessment & Plan Note (Signed)
Immunizations UTD. Discussed the importance of a healthy diet and regular exercise in order for weight loss, and to reduce the risk of any potential medical problems. Exam unremarkable. Labs with increased A1C and LDL, addressed with patient. Follow up in 1 year for CPE.

## 2018-03-24 NOTE — Progress Notes (Signed)
Subjective:    Patient ID: TOUSSAINT GOLSON, male    DOB: 1971-03-23, 47 y.o.   MRN: [id number]  HPI  Mr. Kucinski is a 47 year old male who presents today for complete physical.  Immunizations: -Tetanus: Completed in 2018 -Pneumonia: Completed in 2016  Diet: He endorses a fair diet. Breakfast: Oatmeal, fruit Lunch: Chicken, [place] sandwiches, chips (baked) Dinner: Take out food, restaurants  Snacks: Nuts, veggies Desserts: 3 times weekly  Beverages: Green tea, water  Exercise: He is not currently exercising, plans on walking Eye exam: Due Dental exam: No recent exam  BP Readings from Last 3 Encounters:  03/24/18 118/76  09/24/17 122/80  03/24/17 124/84      Review of Systems  Constitutional: Negative for unexpected weight change.  HENT: Negative for rhinorrhea.   Respiratory: Negative for cough and shortness of breath.   Cardiovascular: Negative for chest pain.  Gastrointestinal: Negative for constipation and diarrhea.  Genitourinary: Negative for difficulty urinating.  Musculoskeletal: Negative for arthralgias and myalgias.  Skin: Negative for rash.  Allergic/Immunologic: Negative for environmental allergies.  Neurological: Negative for dizziness, numbness and headaches.  Psychiatric/Behavioral: Negative for suicidal ideas.       Past Medical History:  Diagnosis Date  . Anxiety and depression   . Asthma   . DVT (deep venous thrombosis) (HCC)   . Essential hypertension   . Gout   . Hyperlipidemia   . Pulmonary embolism (HCC)   . Rheumatoid arthritis (HCC)   . Type 2 diabetes mellitus without complication (HCC)      Social History   Socioeconomic History  . Marital status: Married    Spouse name: Not on file  . Number of children: Not on file  . Years of education: Not on file  . Highest education level: Not on file  Occupational History  . Not on file  Social Needs  . Financial resource strain: Not on file  . Food insecurity:    Worry: Not on file     Inability: Not on file  . Transportation needs:    Medical: Not on file    Non-medical: Not on file  Tobacco Use  . Smoking status: Never Smoker  . Smokeless tobacco: Never Used  Substance and Sexual Activity  . Alcohol use: Yes    Alcohol/week: 0.0 oz    Comment: social  . Drug use: Not on file  . Sexual activity: Not on file  Lifestyle  . Physical activity:    Days per week: Not on file    Minutes per session: Not on file  . Stress: Not on file  Relationships  . Social connections:    Talks on phone: Not on file    Gets together: Not on file    Attends religious service: Not on file    Active member of club or organization: Not on file    Attends meetings of clubs or organizations: Not on file    Relationship status: Not on file  . Intimate partner violence:    Fear of current or ex partner: Not on file    Emotionally abused: Not on file    Physically abused: Not on file    Forced sexual activity: Not on file  Other Topics Concern  . Not on file  Social History Narrative   Married.   2 children.   Disabled and does not work.   Enjoys watching movies, working out, spending time with dogs, traveling.    Past Surgical History:  Procedure  Laterality Date  . KNEE ARTHROSCOPY Right   . KNEE SURGERY Left     Family History  Problem Relation Age of Onset  . Heart disease Father   . Heart attack Father 57       deceased  . Colon cancer Mother 40  . Breast cancer Mother     Allergies  Allergen Reactions  . Oxycodone-Acetaminophen Nausea Only    Current Outpatient Medications on File Prior to Visit  Medication Sig Dispense Refill  . aspirin 81 MG tablet Take 81 mg by mouth daily.     . Glucosamine 500 MG CAPS Take 1,000 mg by mouth daily.     . Multiple Vitamin (MULTIVITAMIN) capsule Take 1 capsule by mouth daily.      No current facility-administered medications on file prior to visit.     BP 118/76   Pulse 85   Temp 98.2 F (36.8 C) (Oral)   Ht 6'  6" (1.981 m)   Wt (!) 321 lb 4 oz (145.7 kg)   SpO2 98%   BMI 37.12 kg/m    Objective:   Physical Exam  Constitutional: He is oriented to person, place, and time. He appears well-nourished.  HENT:  Mouth/Throat: No oropharyngeal exudate.  Eyes: Pupils are equal, round, and reactive to light. EOM are normal.  Neck: Neck supple. No thyromegaly present.  Cardiovascular: Normal rate and regular rhythm.  Respiratory: Effort normal and breath sounds normal.  GI: Soft. Bowel sounds are normal. There is no tenderness.  Musculoskeletal: Normal range of motion.  Neurological: He is alert and oriented to person, place, and time.  Skin: Skin is warm and dry.  Psychiatric: He has a normal mood and affect.           Assessment & Plan:

## 2018-03-24 NOTE — Assessment & Plan Note (Signed)
Stable in the office today, continue lisinopril. BMP unremarkable. 

## 2018-05-11 ENCOUNTER — Ambulatory Visit: Payer: Medicare HMO | Admitting: Family Medicine

## 2018-05-11 ENCOUNTER — Encounter: Payer: Self-pay | Admitting: Family Medicine

## 2018-05-11 ENCOUNTER — Ambulatory Visit (INDEPENDENT_AMBULATORY_CARE_PROVIDER_SITE_OTHER)
Admission: RE | Admit: 2018-05-11 | Discharge: 2018-05-11 | Disposition: A | Discharge status: Home / Self Care | Payer: Medicare HMO | Source: Ambulatory Visit | Attending: Family Medicine | Admitting: Family Medicine

## 2018-05-11 VITALS — BP 142/80 | HR 122 | Temp 99.6°F | Ht 78.0 in | Wt 326.5 lb

## 2018-05-11 DIAGNOSIS — M25571 Pain in right ankle and joints of right foot: Secondary | ICD-10-CM

## 2018-05-11 MED ORDER — COLCHICINE 0.6 MG PO TABS: ORAL_TABLET | ORAL | 0 refills | Status: DC

## 2018-05-11 NOTE — Patient Instructions (Addendum)
I suspect gout  Drink lots of fluids/especially water   Take the colchicine as needed with food  Hold simvastatin while on colchicine   Elevate- cool/warm compress  Xray now- we will contact you with result   Use crutch /cane as needed   Watch purines in diet   Also watch your pulse- if it does not come down - come back in

## 2018-05-11 NOTE — Assessment & Plan Note (Signed)
R ankle swelling and pain  Suspect gout but cannot r/o injury Xray of foot/ankle  Elevate/ice/heat  Colchicine px tid prn  Lab Results  Component Value Date   LABURIC 8.6 (H) 03/17/2018    Enc fluid intake and low purine diet  Pending rad review

## 2018-05-11 NOTE — Progress Notes (Signed)
Subjective:    Patient ID: Nathan BackersBrian D Rabbani, male    DOB: Mar 03, 1971, 47 y.o.   MRN: [id number]  HPI 47 yo pt of NP Clark here for ankle injury (right)  Saturday night- went to a baseball game (walked on uneven ground) -wearing flip flops Long walk to the car  That night R ankle was a little sore Sunday am - a little worse  Then hurt to walk on it  Elevated /ice/heat  Worst medially   6/10 on pain scale  Took advil - 5  At one time   A little warm  A little flushed looking   No pain for sheet to touch it in bed  Hx of gout- no flare in years   Does exercise  Goes to the gym - more lifting than cardio/ some bike   Wt Readings from Last 3 Encounters:  05/11/18 (!) 326 lb 8 oz (148.1 kg)  03/24/18 (!) 321 lb 4 oz (145.7 kg)  09/24/17 (!) 314 lb 6.4 oz (142.6 kg)  37.73 kg/m  Has been steadily working on wt loss with large loss in the past     BP Readings from Last 3 Encounters:  05/11/18 (!) 142/80  03/24/18 118/76  09/24/17 122/80   Pulse Readings from Last 3 Encounters:  05/11/18 (!) 122  03/24/18 85  09/24/17 68  pt thinks the heat and pain made pulse go up     Lab Results  Component Value Date   LABURIC 8.6 (H) 03/17/2018    Patient Active Problem List   Diagnosis Date Noted  . Right ankle pain 05/11/2018  . Preventative health care 03/19/2016  . Type 2 diabetes mellitus without complication, without long-term current use of insulin (HCC) 08/03/2015  . Anxiety and depression 08/03/2015  . Hyperlipidemia 08/03/2015  . Essential hypertension 08/03/2015  . Gout 08/03/2015   Past Medical History:  Diagnosis Date  . Anxiety and depression   . Asthma   . DVT (deep venous thrombosis) (HCC)   . Essential hypertension   . Gout   . Hyperlipidemia   . Pulmonary embolism (HCC)   . Rheumatoid arthritis (HCC)   . Type 2 diabetes mellitus without complication [facility](HCC)    Past Surgical History:  Procedure Laterality Date  . KNEE ARTHROSCOPY Right   . KNEE  SURGERY Left    Social History   Tobacco Use  . Smoking status: Never Smoker  . Smokeless tobacco: Never Used  Substance Use Topics  . Alcohol use: Yes    Alcohol/week: 0.0 oz    Comment: social  . Drug use: Not on file   Family History  Problem Relation Age of Onset  . Heart disease Father   . Heart attack Father [id number]       deceased  . Colon cancer Mother [id number]  . Breast cancer Mother    Allergies  Allergen Reactions  . Oxycodone-Acetaminophen Nausea Only   Current Outpatient Medications on File Prior to Visit  Medication Sig Dispense Refill  . albuterol (PROVENTIL HFA;VENTOLIN HFA) 108 (90 Base) MCG/ACT inhaler Inhale 2 puffs into the lungs every 4 (four) hours as needed for wheezing or shortness of breath. 1 Inhaler 0  . aspirin 81 MG tablet Take 81 mg by mouth daily.     . citalopram (CELEXA) 20 MG tablet Take 1 tablet (20 mg total) by mouth daily. 90 tablet 3  . Glucosamine 500 MG CAPS Take 1,000 mg by mouth daily.     Marland Kitchen. lisinopril (  PRINIVIL,ZESTRIL) 10 MG tablet Take 1 tablet by mouth once daily for blood pressure. 90 tablet 3  . metFORMIN (GLUCOPHAGE) 1000 MG tablet TAKE 1 TABLET (1,000 MG TOTAL) BY MOUTH 2 (TWO) TIMES DAILY WITH MEALS. 180 tablet 3  . Multiple Vitamin (MULTIVITAMIN) capsule Take 1 capsule by mouth daily.     . simvastatin (ZOCOR) 40 MG tablet Take 1 tablet by mouth every evening for cholesterol. 90 tablet 3   No current facility-administered medications on file prior to visit.     Review of Systems  Constitutional: Negative for activity change, appetite change, fatigue, fever and unexpected weight change.  HENT: Negative for congestion, rhinorrhea, sore throat and trouble swallowing.   Eyes: Negative for pain, redness, itching and visual disturbance.  Respiratory: Negative for cough, chest tightness, shortness of breath and wheezing.   Cardiovascular: Negative for chest pain and palpitations.  Gastrointestinal: Negative for abdominal pain, blood in  stool, constipation, diarrhea and nausea.  Endocrine: Negative for cold intolerance, heat intolerance, polydipsia and polyuria.  Genitourinary: Negative for difficulty urinating, dysuria, frequency and urgency.  Musculoskeletal: Negative for arthralgias, joint swelling and myalgias.       Right ankle pain and swelling   Skin: Negative for pallor and rash.  Neurological: Negative for dizziness, tremors, weakness, numbness and headaches.  Hematological: Negative for adenopathy. Does not bruise/bleed easily.  Psychiatric/Behavioral: Negative for decreased concentration and dysphoric mood. The patient is not nervous/anxious.        Objective:   Physical Exam  Constitutional: He is oriented to person, place, and time. He appears well-developed and well-nourished. No distress.  obese and well appearing   HENT:  Head: Normocephalic and atraumatic.  Cardiovascular: Regular rhythm and normal heart sounds.  No murmur heard. Tachycardic with rate in one-teens   Pulmonary/Chest: Effort normal and breath sounds normal. No respiratory distress.  Musculoskeletal: He exhibits edema and tenderness.       Right ankle: He exhibits decreased range of motion and swelling. He exhibits no ecchymosis, no deformity, no laceration and normal pulse. Tenderness. Lateral malleolus, medial malleolus, AITFL and posterior TFL tenderness found. Achilles tendon normal.       Right foot: There is decreased range of motion, tenderness, bony tenderness and swelling. There is no crepitus and no deformity.  Ankle is erythematous /mildly and warm  Pain with movement in all directions Tenderness of bilat malleolus  No achilles tenderness Some foot tenderness-proximal 4th metatarsal head area  Nl perf  Gait favors L /can bear partial weight   Neurological: He is alert and oriented to person, place, and time. He has normal strength. He displays no atrophy and normal reflexes. No sensory deficit. He exhibits normal muscle tone.   Skin: Skin is warm and dry. No rash noted.  Psychiatric: He has a normal mood and affect.          Assessment & Plan:   Problem List Items Addressed This Visit      Other   Right ankle pain - Primary    R ankle swelling and pain  Suspect gout but cannot r/o injury Xray of foot/ankle  Elevate/ice/heat  Colchicine px tid prn  Lab Results  Component Value Date   LABURIC 8.6 (H) 03/17/2018    Enc fluid intake and low purine diet  Pending rad review        Relevant Orders   DG Ankle Complete Right   DG Foot Complete Right

## 2018-05-25 ENCOUNTER — Emergency Department
Admission: EM | Admit: 2018-05-25 | Discharge: 2018-05-26 | Disposition: A | Discharge status: Home / Self Care | Payer: Medicare HMO | Attending: Emergency Medicine | Admitting: Emergency Medicine

## 2018-05-25 ENCOUNTER — Emergency Department: Payer: Medicare HMO

## 2018-05-25 ENCOUNTER — Encounter: Payer: Self-pay | Admitting: Emergency Medicine

## 2018-05-25 ENCOUNTER — Other Ambulatory Visit: Payer: Self-pay

## 2018-05-25 DIAGNOSIS — J45909 Unspecified asthma, uncomplicated: Secondary | ICD-10-CM | POA: Diagnosis not present

## 2018-05-25 DIAGNOSIS — N23 Unspecified renal colic: Secondary | ICD-10-CM | POA: Insufficient documentation

## 2018-05-25 DIAGNOSIS — E119 Type 2 diabetes mellitus without complications: Secondary | ICD-10-CM | POA: Diagnosis not present

## 2018-05-25 DIAGNOSIS — R1032 Left lower quadrant pain: Secondary | ICD-10-CM | POA: Diagnosis present

## 2018-05-25 LAB — COMPREHENSIVE METABOLIC PANEL
ALT: 68 U/L — ABNORMAL HIGH (ref 0–44)
AST: 48 U/L — ABNORMAL HIGH (ref 15–41)
Albumin: 4.6 g/dL (ref 3.5–5.0)
Alkaline Phosphatase: 57 U/L (ref 38–126)
Anion gap: 8 (ref 5–15)
BUN: 14 mg/dL (ref 6–20)
CO2: 26 mmol/L (ref 22–32)
Calcium: 9.2 mg/dL (ref 8.9–10.3)
Chloride: 105 mmol/L (ref 98–111)
Creatinine, Ser: 1.12 mg/dL (ref 0.61–1.24)
GFR calc Af Amer: >60 mL/min (ref >60)
GFR calc non Af Amer: >60 mL/min (ref >60)
GLUCOSE: 180 mg/dL — AB (ref 70–99)
POTASSIUM: 3.8 mmol/L (ref 3.5–5.1)
Sodium: 139 mmol/L (ref 135–145)
TOTAL PROTEIN: 7.5 g/dL (ref 6.5–8.1)
Total Bilirubin: 0.5 mg/dL (ref 0.3–1.2)

## 2018-05-25 LAB — CBC WITH DIFFERENTIAL/PLATELET
BASOS ABS: 0.1 10*3/uL (ref 0–0.1)
Basophils Relative: 1 %
EOS PCT: 3 %
Eosinophils Absolute: 0.2 10*3/uL (ref 0–0.7)
HCT: 42.9 % (ref 40.0–52.0)
Hemoglobin: 14.9 g/dL (ref 13.0–18.0)
LYMPHS ABS: 3.2 10*3/uL (ref 1.0–3.6)
LYMPHS PCT: 44 %
MCH: 30.6 pg (ref 26.0–34.0)
MCHC: 34.7 g/dL (ref 32.0–36.0)
MCV: 88.1 fL (ref 80.0–100.0)
MONO ABS: 0.6 10*3/uL (ref 0.2–1.0)
Monocytes Relative: 9 %
Neutro Abs: 3.1 10*3/uL (ref 1.4–6.5)
Neutrophils Relative %: 43 %
PLATELETS: 242 10*3/uL (ref 150–440)
RBC: 4.87 MIL/uL (ref 4.40–5.90)
RDW: 13.1 % (ref 11.5–14.5)
WBC: 7.1 10*3/uL (ref 3.8–10.6)

## 2018-05-25 LAB — LIPASE, BLOOD: Lipase: 52 U/L — ABNORMAL HIGH (ref 11–51)

## 2018-05-25 MED ORDER — SODIUM CHLORIDE 0.9 % IV SOLN
Freq: Once | INTRAVENOUS | Status: AC
  Administered 2018-05-25: 23:00:00 via INTRAVENOUS

## 2018-05-25 MED ORDER — HYDROMORPHONE HCL 1 MG/ML IJ SOLN
0.5000 mg | Freq: Once | INTRAMUSCULAR | Status: AC
Start: 2018-05-25 — End: 2018-05-25
  Administered 2018-05-25: 0.5 mg via INTRAVENOUS
  Filled 2018-05-25: qty 1

## 2018-05-25 MED ORDER — ONDANSETRON HCL 4 MG/2ML IJ SOLN
4.0000 mg | Freq: Once | INTRAMUSCULAR | Status: AC
  Administered 2018-05-25: 4 mg via INTRAVENOUS
  Filled 2018-05-25: qty 2

## 2018-05-25 MED ORDER — KETOROLAC TROMETHAMINE 30 MG/ML IJ SOLN
30.0000 mg | Freq: Once | INTRAMUSCULAR | Status: AC
  Administered 2018-05-25: 30 mg via INTRAVENOUS
  Filled 2018-05-25: qty 1

## 2018-05-25 MED ORDER — ONDANSETRON 4 MG PO TBDP: 4.0000 mg | ORAL_TABLET | Freq: Three times a day (TID) | ORAL | 0 refills | Status: DC | PRN

## 2018-05-25 MED ORDER — TAMSULOSIN HCL 0.4 MG PO CAPS: 0.4000 mg | ORAL_CAPSULE | Freq: Every day | ORAL | 0 refills | Status: DC

## 2018-05-25 MED ORDER — HYDROCODONE-ACETAMINOPHEN 5-325 MG PO TABS: 1.0000 | ORAL_TABLET | Freq: Three times a day (TID) | ORAL | 0 refills | Status: DC

## 2018-05-25 NOTE — ED Notes (Signed)
Pt aware that we need urine sample. Specimen cup left at bedside. Pt and family deny needs/complaints. Call bell within reach

## 2018-05-25 NOTE — ED Provider Notes (Signed)
[facility] Emergency Department Provider Note       Time seen: ----------------------------------------- 10:09 PM on 05/25/2018 -----------------------------------------   I have reviewed the triage vital signs and the nursing notes.  HISTORY   Chief Complaint Abdominal Pain    HPI Nathan Krause is a 47 y.o. male with a history of anxiety, depression, DVT, hypertension, hyperlipidemia, PE, diabetes who presents to the ED for left lower quadrant abdominal pain accompanied by nausea vomiting today.  Patient reports a history of same with a kidney stone one year ago.  Nothing is made his pain better.  Patient still has some nausea associated with his pain.  Past Medical History:  Diagnosis Date  . Anxiety and depression   . Asthma   . DVT (deep venous thrombosis) (HCC)   . Essential hypertension   . Gout   . Hyperlipidemia   . Pulmonary embolism (HCC)   . Rheumatoid arthritis (HCC)   . Type 2 diabetes mellitus without complication [facility](HCC)     Patient Active Problem List   Diagnosis Date Noted  . Right ankle pain 05/11/2018  . Preventative health care 03/19/2016  . Type 2 diabetes mellitus without complication, without long-term current use of insulin (HCC) 08/03/2015  . Anxiety and depression 08/03/2015  . Hyperlipidemia 08/03/2015  . Essential hypertension 08/03/2015  . Gout 08/03/2015    Past Surgical History:  Procedure Laterality Date  . KNEE ARTHROSCOPY Right   . KNEE SURGERY Left     Allergies Oxycodone-acetaminophen  Social History Social History   Tobacco Use  . Smoking status: Never Smoker  . Smokeless tobacco: Never Used  Substance Use Topics  . Alcohol use: Yes    Alcohol/week: 0.0 oz    Comment: social  . Drug use: Not on file   Review of Systems Constitutional: Negative for fever. Cardiovascular: Negative for chest pain. Respiratory: Negative for shortness of breath. Gastrointestinal: Positive for abdominal pain,  vomiting Musculoskeletal: Negative for back pain. Skin: Negative for rash. Neurological: Negative for headaches, focal weakness or numbness.  All systems negative/normal/unremarkable except as stated in the HPI  ____________________________________________   PHYSICAL EXAM:  VITAL SIGNS: ED Triage Vitals  Enc Vitals Group     BP 05/25/18 2156 (!) 144/91     Pulse Rate 05/25/18 2156 92     Resp 05/25/18 2156 20     Temp 05/25/18 2156 98 F (36.7 C)     Temp Source 05/25/18 2156 Oral     SpO2 05/25/18 2156 97 %     Weight 05/25/18 2155 (!) 320 lb (145.2 kg)     Height 05/25/18 2155 6\' 6"  (1.981 m)     Head Circumference --      Peak Flow --      Pain Score 05/25/18 2155 6     Pain Loc --      Pain Edu? --      Excl. in GC? --    Constitutional: Alert and oriented.  Mild distress Eyes: Conjunctivae are normal. Normal extraocular movements. ENT   Head: Normocephalic and atraumatic.   Nose: No congestion/rhinnorhea.   Mouth/Throat: Mucous membranes are moist.   Neck: No stridor. Cardiovascular: Normal rate, regular rhythm. No murmurs, rubs, or gallops. Respiratory: Normal respiratory effort without tachypnea nor retractions. Breath sounds are clear and equal bilaterally. No wheezes/rales/rhonchi. Gastrointestinal: Left flank tenderness, no rebound or guarding.  Normal bowel sounds. Musculoskeletal: Nontender with normal range of motion in extremities. No lower extremity tenderness nor edema. Neurologic:  Normal speech and language. No gross focal neurologic deficits are appreciated.  Skin:  Skin is warm, dry and intact. No rash noted. Psychiatric: Mood and affect are normal. Speech and behavior are normal.  ____________________________________________  ED COURSE:  As part of my medical decision making, I reviewed the following data within the electronic MEDICAL RECORD NUMBER History obtained from family if available, nursing notes, old chart and ekg, as well as notes  from prior ED visits. Patient presented for abdominal pain and likely renal colic, we will assess with labs and imaging as indicated at this time.   Procedures ____________________________________________   LABS (pertinent positives/negatives)  Labs Reviewed  LIPASE, BLOOD - Abnormal; Notable for the following components:      Result Value   Lipase 52 (*)    All other components within normal limits  COMPREHENSIVE METABOLIC PANEL - Abnormal; Notable for the following components:   Glucose, Bld 180 (*)    AST 48 (*)    ALT 68 (*)    All other components within normal limits  CBC WITH DIFFERENTIAL/PLATELET  URINALYSIS, COMPLETE (UACMP) WITH MICROSCOPIC    RADIOLOGY  KUB IMPRESSION: 5 mm density to left of spine at the L3-4 level may represent a proximal ureter stone. ____________________________________________  DIFFERENTIAL DIAGNOSIS   Renal colic, UTI, pyelonephritis, gas pain  FINAL ASSESSMENT AND PLAN  Renal colic   Plan: The patient had presented for right flank pain. Patient's labs are unremarkable. Patient's imaging did suggest a renal stone.  Patient was given Toradol, Dilaudid and fluids with improvement in his symptoms.  He is cleared for outpatient follow-up.   Ulice Dash, MD   Note: This note was generated in part or whole with voice recognition software. Voice recognition is usually quite accurate but there are transcription errors that can and very often do occur. I apologize for any typographical errors that were not detected and corrected.     Emily Filbert, MD 05/25/18 2252

## 2018-05-25 NOTE — ED Notes (Signed)
ED Provider at bedside. 

## 2018-05-25 NOTE — ED Provider Notes (Signed)
-----------------------------------------   11:04 PM on 05/25/2018 -----------------------------------------   Assuming care from Dr. Mayford KnifeWilliams.  In short, Nathan Krause is a 47 y.o. male with a chief complaint of abdominal pain thought to be due to .  Refer to the original H&P for additional details.  The current plan of care is to follow up on urinalysis and discharge with outpatient follow up.   ----------------------------------------- 1:55 AM on 05/26/2018 -----------------------------------------  hematuria but no evidence of infection on urinalysis.  Pain is adequately controlled.  Will discharge according to Dr. Mayford KnifeWilliams plan   Loleta RoseForbach, Aniko Finnigan, MD 05/26/18 [phone]

## 2018-05-25 NOTE — ED Notes (Signed)
Pt to XR. ABCs intact. NAD 

## 2018-05-25 NOTE — ED Triage Notes (Signed)
Pt to triage via w/c with no distress noted; pt reports left lower abd pain accomp by N/V today; st hx of same with kidney stone yr ago

## 2018-05-26 LAB — URINALYSIS, COMPLETE (UACMP) WITH MICROSCOPIC
BACTERIA UA: NONE SEEN
BILIRUBIN URINE: NEGATIVE
Glucose, UA: NEGATIVE mg/dL
Ketones, ur: NEGATIVE mg/dL
Leukocytes, UA: NEGATIVE
Nitrite: NEGATIVE
Protein, ur: NEGATIVE mg/dL
RBC / HPF: >50 RBC/hpf — ABNORMAL HIGH (ref 0–5)
SPECIFIC GRAVITY, URINE: 1.024 (ref 1.005–1.030)
pH: 5 (ref 5.0–8.0)

## 2018-05-26 MED ORDER — OXYCODONE-ACETAMINOPHEN 5-325 MG PO TABS
ORAL_TABLET | ORAL | Status: AC
  Filled 2018-05-26: qty 2

## 2018-05-26 MED ORDER — OXYCODONE-ACETAMINOPHEN 5-325 MG PO TABS
2.0000 | ORAL_TABLET | Freq: Once | ORAL | Status: AC
  Administered 2018-05-26: 2 via ORAL

## 2018-05-26 NOTE — ED Notes (Signed)
Reviewed discharge instructions, follow-up care, and prescriptions with patient. Patient verbalized understanding of all information reviewed. Patient stable, with no distress noted at this time.    

## 2018-05-28 NOTE — Progress Notes (Signed)
05/31/2018 12:18 PM   Nathan Krause [birth year] [id number]  Referring provider: Doreene Nest, NP [address] [place], [place] [address]  Chief Complaint  Patient presents with  . Nephrolithiasis    HPI: Patient is a 47 -year-old Caucasian male who was referred by [facility] ED for nephrolithiasis.    ED: He presented to the ED on 05/25/2018 with the complaint of LLQ pain associated with nausea.   KUB taken noted 5 mm density to left of spine at the L3-4 level may represent a proximal ureter stone.   Labs in the ED:  WBC count 7.1.  Creatinine 1.12.  UA > 50 RBC's and 0-5 WBC's.   Meds given in the ED: Percocet, Vicodin, Dilaudid IM, Toradol IM, Zofran ODT and Flomax.    Prior urological history:  Hx of stones.  Spontaneously passed.     Current NSAID/anticoagulation:   Colchicine took two weeks ago.  ASA 81 mg took last night.  Advil yesterday.     Today, he is having 4/5 pain at this time.  He is having left side pain that radiates to his left groin.  Patient denies any gross hematuria, dysuria or suprapubic/flank pain.  Patient denies any fevers, chills, nausea or vomiting.    PMH: Past Medical History:  Diagnosis Date  . Anxiety and depression   . Asthma   . DVT (deep venous thrombosis) (HCC)   . Essential hypertension   . Gout   . Hyperlipidemia   . Pulmonary embolism (HCC)   . Rheumatoid arthritis (HCC)   . Type 2 diabetes mellitus without complication [facility])     Surgical History: Past Surgical History:  Procedure Laterality Date  . KNEE ARTHROSCOPY Right   . KNEE SURGERY Left     Home Medications:  Allergies as of 05/31/2018      Reactions   Oxycodone-acetaminophen Nausea Only      Medication List        Accurate as of 05/31/18 11:59 PM. Always use your most recent med list.          albuterol 108 (90 Base) MCG/ACT inhaler Commonly known as:  PROVENTIL HFA;VENTOLIN HFA Inhale 2 puffs into the lungs every 4 (four) hours as needed for wheezing  or shortness of breath.   aspirin 81 MG tablet Take 81 mg by mouth daily.   citalopram 20 MG tablet Commonly known as:  CELEXA Take 1 tablet (20 mg total) by mouth daily.   colchicine 0.6 MG tablet Take one pill by mouth up to three times daily with food as needed for gout pain   Glucosamine 500 MG Caps Take 1,000 mg by mouth daily.   HYDROCHLOROTHIAZIDE PO Take by mouth.   HYDROcodone-acetaminophen 5-325 MG tablet Commonly known as:  NORCO/VICODIN Take 1 tablet by mouth 3 (three) times daily.   lisinopril 10 MG tablet Commonly known as:  PRINIVIL,ZESTRIL Take 1 tablet by mouth once daily for blood pressure.   metFORMIN 1000 MG tablet Commonly known as:  GLUCOPHAGE TAKE 1 TABLET (1,000 MG TOTAL) BY MOUTH 2 (TWO) TIMES DAILY WITH MEALS.   multivitamin capsule Take 1 capsule by mouth daily.   ondansetron 4 MG disintegrating tablet Commonly known as:  ZOFRAN ODT Take 1 tablet (4 mg total) by mouth every 8 (eight) hours as needed for nausea or vomiting.   simvastatin 40 MG tablet Commonly known as:  ZOCOR Take 1 tablet by mouth every evening for cholesterol.   tamsulosin 0.4 MG Caps  capsule Commonly known as:  FLOMAX Take 1 capsule (0.4 mg total) by mouth daily after breakfast.       Allergies:  Allergies  Allergen Reactions  . Oxycodone-Acetaminophen Nausea Only    Family History: Family History  Problem Relation Age of Onset  . Heart disease Father   . Heart attack Father 1846       deceased  . Colon cancer Mother 1566  . Breast cancer Mother     Social History:  reports that he has never smoked. He has never used smokeless tobacco. He reports that he drinks alcohol. He reports that he does not use drugs.  ROS: UROLOGY Frequent Urination?: No Hard to postpone urination?: No Burning/pain with urination?: No Get up at night to urinate?: No Leakage of urine?: No Urine stream starts and stops?: No Trouble starting stream?: No Do you have to strain to  urinate?: No Blood in urine?: No Urinary tract infection?: No Sexually transmitted disease?: No Injury to kidneys or bladder?: No Painful intercourse?: No Weak stream?: No Erection problems?: No Penile pain?: No  Gastrointestinal Nausea?: Yes Vomiting?: Yes Indigestion/heartburn?: No Diarrhea?: No Constipation?: No  Constitutional Fever: No Night sweats?: No Weight loss?: No Fatigue?: No  Skin Skin rash/lesions?: No Itching?: No  Eyes Blurred vision?: No Double vision?: No  Ears/Nose/Throat Sore throat?: No Sinus problems?: No  Hematologic/Lymphatic Swollen glands?: No Easy bruising?: No  Cardiovascular Leg swelling?: No Chest pain?: No  Respiratory Cough?: No Shortness of breath?: No  Endocrine Excessive thirst?: No  Musculoskeletal Back pain?: No Joint pain?: No  Neurological Headaches?: No Dizziness?: No  Psychologic Depression?: No Anxiety?: Yes  Physical Exam: BP 116/87 (BP Location: Left Arm, Patient Position: Sitting, Cuff Size: Normal)   Pulse (!) 106   Ht 6\' 6"  (1.981 m)   Wt (!) 323 lb 8 oz (146.7 kg)   BMI 37.38 kg/m   Constitutional:  Well nourished. Alert and oriented, No acute distress. HEENT: [place] AT, moist mucus membranes.  Trachea midline, no masses. Cardiovascular: No clubbing, cyanosis, or edema. Respiratory: Normal respiratory effort, no increased work of breathing. GI: Abdomen is soft, non tender, non distended, no abdominal masses. Liver and spleen not palpable.  No hernias appreciated.  Stool sample for occult testing is not indicated.   GU: No CVA tenderness.  No bladder fullness or masses.   Skin: No rashes, bruises or suspicious lesions. Lymph: No cervical or inguinal adenopathy. Neurologic: Grossly intact, no focal deficits, moving all 4 extremities. Psychiatric: Normal mood and affect.  Laboratory Data: Lab Results  Component Value Date   WBC 7.1 05/25/2018   HGB 14.9 05/25/2018   HCT 42.9 05/25/2018   MCV  88.1 05/25/2018   PLT 242 05/25/2018    Lab Results  Component Value Date   CREATININE 1.12 05/25/2018    No results found for: PSA  No results found for: TESTOSTERONE  Lab Results  Component Value Date   HGBA1C 7.8 (H) 03/17/2018    No results found for: TSH     Component Value Date/Time   CHOL 168 03/17/2018 0809   HDL 37.20 (L) 03/17/2018 0809   CHOLHDL 5 03/17/2018 0809   VLDL 42.8 (H) 03/17/2018 0809   LDLCALC 81 03/24/2017 0926    Lab Results  Component Value Date   AST 48 (H) 05/25/2018   Lab Results  Component Value Date   ALT 68 (H) 05/25/2018   No components found for: ALKALINEPHOPHATASE No components found for: BILIRUBINTOTAL  No results found  for: ESTRADIOL  Urinalysis 11-30 RBC's    I have reviewed the labs.   Pertinent Imaging: CLINICAL DATA:  47 y/o M; left lower abdominal pain with nausea and vomiting.  EXAM: ABDOMEN - 1 VIEW  COMPARISON:  01/20/2017 CT abdomen and pelvis.  FINDINGS: The bowel gas pattern is normal. 5 mm density to left of spine at the L3-4 level may represent a proximal ureter stone.  IMPRESSION: 5 mm density to left of spine at the L3-4 level may represent a proximal ureter stone.   Electronically Signed   By: Mitzi Hansen M.D.   On: 05/25/2018 22:37  I have independently reviewed the films.    Assessment & Plan:    1. Left ureteral stone Explained to patient the need for CT renal stone study to confirm position and present of stone - see below Explained to the patient that he could continue to push fluids and take tamsulosin and effort to pass the stone on his own, but he states he is at a point where he would like to disc at rid of it.  We discussed ESWL and ureteroscopy.  He would like to pursue ESWL.  I did explain to him that it has a lower success rate than ureteroscopy and that if he fails ESWL he may still need to undergo ureteroscopy with a stent placement. I explained that ESWL  is a means of pulverizing urinary stones without surgery using shockwave therapy I discussed the risks involved with ESWL consist of bruising to the skin and kidney region as a result of the shockwave, possibility of long-term kidney damage, development of high blood pressure and damage to the bowel or long, hematuria, urinary bleeding serious enough to require transfusion or surgical repair or removal of the kidney is rare, rare chance of hematoma formation in the kidney and injuries to the spleen, liver or pancreas.  There is also the risk of urinary tract infection or any infection of the blood system or tissue.   There is also a possibility of resultant damage to male organs. I explained that sometimes the stone fragments can stack up in the ureter like coins, a phenomenon described as "Steinstrasse", that would result in a stent placement and/or URS for further treatment I informed the patient that IV sedation is typically used on the truck, but it some rare instances we need to use general anesthesia - the risks being infection, irregular heart beat, irregular BP, stroke, MI, CVA, paralysis, coma and/or death. Patient to be scheduled for left ESWL   2. Left hydronephrosis Obtain RUS to ensure the hydronephrosis has resolved once they have passed and/or recovered from procedure to ensure to iatrogenic hydronephrosis remains - it is explained to the patient that it is important to document resolution of the hydronephrosis as "silent hydronephrosis" can occur and cause damage and/or loss of the kidney  3. Microscopic hematuria UA today demonstrates 11-30 RBC's Continue to monitor the patient's UA after the treatment/passage of the stone to ensure the hematuria has resolved If hematuria persists, we will pursue a hematuria workup with CT Urogram and cystoscopy if appropriate.  Return for left ESWL .  These notes generated with voice recognition software. I apologize for typographical  errors.  Michiel Cowboy, PA-C  [facility] [address]  [address] [place], [place] [address] [phone]  Addendum CLINICAL DATA:  Left-sided flank pain for 1 week. Microscopic hematuria. Nausea and dysuria.  EXAM: CT ABDOMEN AND PELVIS WITHOUT CONTRAST  TECHNIQUE:  Multidetector CT imaging of the abdomen and pelvis was performed following the standard protocol without IV contrast.  COMPARISON:  01/20/2017  FINDINGS: Lower chest: No acute findings. Tiny less than 5 mm nodules in both lung bases are stable, consistent with benign etiology.  Hepatobiliary: No mass visualized on this unenhanced exam. Mild diffuse hepatic steatosis. Gallbladder is unremarkable.  Pancreas: No mass or inflammatory process visualized on this unenhanced exam.  Spleen:  Within normal limits in size.  Adrenals/Urinary tract: Mild left hydroureteronephrosis and perinephric stranding is seen due to a 4 mm calculus in the distal left ureter. No other urinary tract calculi identified. Unremarkable unopacified urinary bladder.  Stomach/Bowel: No evidence of obstruction, inflammatory process, or abnormal fluid collections.  Vascular/Lymphatic: No pathologically enlarged lymph nodes identified. No evidence of abdominal aortic aneurysm.  Reproductive:  No mass or other significant abnormality.  Other:  None.  Musculoskeletal:  No suspicious bone lesions identified.  IMPRESSION: Mild left hydroureteronephrosis due to 4 mm calculus in distal left ureter.   Electronically Signed   By: Myles Rosenthal M.D.   On: 06/01/2018 12:33

## 2018-05-28 NOTE — H&P (View-Only) (Signed)
  05/31/2018 12:18 PM   Nathan Krause 02/19/1971 [id number]  Referring provider: Clark, Katherine K, NP [address] [place], [place] [address]  Chief Complaint  Patient presents with  . Nephrolithiasis    HPI: Patient is a 47 -year-old Caucasian male who was referred by [facility]'s ED for nephrolithiasis.    ED: He presented to the ED on 05/25/2018 with the complaint of LLQ pain associated with nausea.   KUB taken noted 5 mm density to left of spine at the L3-4 level may represent a proximal ureter stone.   Labs in the ED:  WBC count 7.1.  Creatinine 1.12.  UA > 50 RBC's and 0-5 WBC's.   Meds given in the ED: Percocet, Vicodin, Dilaudid IM, Toradol IM, Zofran ODT and Flomax.    Prior urological history:  Hx of stones.  Spontaneously passed.     Current NSAID/anticoagulation:   Colchicine took two weeks ago.  ASA 81 mg took last night.  Advil yesterday.     Today, he is having 4/5 pain at this time.  He is having left side pain that radiates to his left groin.  Patient denies any gross hematuria, dysuria or suprapubic/flank pain.  Patient denies any fevers, chills, nausea or vomiting.    PMH: Past Medical History:  Diagnosis Date  . Anxiety and depression   . Asthma   . DVT (deep venous thrombosis) (HCC)   . Essential hypertension   . Gout   . Hyperlipidemia   . Pulmonary embolism (HCC)   . Rheumatoid arthritis (HCC)   . Type 2 diabetes mellitus without complication (HCC)     Surgical History: Past Surgical History:  Procedure Laterality Date  . KNEE ARTHROSCOPY Right   . KNEE SURGERY Left     Home Medications:  Allergies as of 05/31/2018      Reactions   Oxycodone-acetaminophen Nausea Only      Medication List        Accurate as of 05/31/18 11:59 PM. Always use your most recent med list.          albuterol 108 (90 Base) MCG/ACT inhaler Commonly known as:  PROVENTIL HFA;VENTOLIN HFA Inhale 2 puffs into the lungs every 4 (four) hours as needed for wheezing  or shortness of breath.   aspirin 81 MG tablet Take 81 mg by mouth daily.   citalopram 20 MG tablet Commonly known as:  CELEXA Take 1 tablet (20 mg total) by mouth daily.   colchicine 0.6 MG tablet Take one pill by mouth up to three times daily with food as needed for gout pain   Glucosamine 500 MG Caps Take 1,000 mg by mouth daily.   HYDROCHLOROTHIAZIDE PO Take by mouth.   HYDROcodone-acetaminophen 5-325 MG tablet Commonly known as:  NORCO/VICODIN Take 1 tablet by mouth 3 (three) times daily.   lisinopril 10 MG tablet Commonly known as:  PRINIVIL,ZESTRIL Take 1 tablet by mouth once daily for blood pressure.   metFORMIN 1000 MG tablet Commonly known as:  GLUCOPHAGE TAKE 1 TABLET (1,000 MG TOTAL) BY MOUTH 2 (TWO) TIMES DAILY WITH MEALS.   multivitamin capsule Take 1 capsule by mouth daily.   ondansetron 4 MG disintegrating tablet Commonly known as:  ZOFRAN ODT Take 1 tablet (4 mg total) by mouth every 8 (eight) hours as needed for nausea or vomiting.   simvastatin 40 MG tablet Commonly known as:  ZOCOR Take 1 tablet by mouth every evening for cholesterol.   tamsulosin 0.4 MG Caps   capsule Commonly known as:  FLOMAX Take 1 capsule (0.4 mg total) by mouth daily after breakfast.       Allergies:  Allergies  Allergen Reactions  . Oxycodone-Acetaminophen Nausea Only    Family History: Family History  Problem Relation Age of Onset  . Heart disease Father   . Heart attack Father 46       deceased  . Colon cancer Mother 66  . Breast cancer Mother     Social History:  reports that he has never smoked. He has never used smokeless tobacco. He reports that he drinks alcohol. He reports that he does not use drugs.  ROS: UROLOGY Frequent Urination?: No Hard to postpone urination?: No Burning/pain with urination?: No Get up at night to urinate?: No Leakage of urine?: No Urine stream starts and stops?: No Trouble starting stream?: No Do you have to strain to  urinate?: No Blood in urine?: No Urinary tract infection?: No Sexually transmitted disease?: No Injury to kidneys or bladder?: No Painful intercourse?: No Weak stream?: No Erection problems?: No Penile pain?: No  Gastrointestinal Nausea?: Yes Vomiting?: Yes Indigestion/heartburn?: No Diarrhea?: No Constipation?: No  Constitutional Fever: No Night sweats?: No Weight loss?: No Fatigue?: No  Skin Skin rash/lesions?: No Itching?: No  Eyes Blurred vision?: No Double vision?: No  Ears/Nose/Throat Sore throat?: No Sinus problems?: No  Hematologic/Lymphatic Swollen glands?: No Easy bruising?: No  Cardiovascular Leg swelling?: No Chest pain?: No  Respiratory Cough?: No Shortness of breath?: No  Endocrine Excessive thirst?: No  Musculoskeletal Back pain?: No Joint pain?: No  Neurological Headaches?: No Dizziness?: No  Psychologic Depression?: No Anxiety?: Yes  Physical Exam: BP 116/87 (BP Location: Left Arm, Patient Position: Sitting, Cuff Size: Normal)   Pulse (!) 106   Ht 6' 6" (1.981 m)   Wt (!) 323 lb 8 oz (146.7 kg)   BMI 37.38 kg/m   Constitutional:  Well nourished. Alert and oriented, No acute distress. HEENT: [place] AT, moist mucus membranes.  Trachea midline, no masses. Cardiovascular: No clubbing, cyanosis, or edema. Respiratory: Normal respiratory effort, no increased work of breathing. GI: Abdomen is soft, non tender, non distended, no abdominal masses. Liver and spleen not palpable.  No hernias appreciated.  Stool sample for occult testing is not indicated.   GU: No CVA tenderness.  No bladder fullness or masses.   Skin: No rashes, bruises or suspicious lesions. Lymph: No cervical or inguinal adenopathy. Neurologic: Grossly intact, no focal deficits, moving all 4 extremities. Psychiatric: Normal mood and affect.  Laboratory Data: Lab Results  Component Value Date   WBC 7.1 05/25/2018   HGB 14.9 05/25/2018   HCT 42.9 05/25/2018   MCV  88.1 05/25/2018   PLT 242 05/25/2018    Lab Results  Component Value Date   CREATININE 1.12 05/25/2018    No results found for: PSA  No results found for: TESTOSTERONE  Lab Results  Component Value Date   HGBA1C 7.8 (H) 03/17/2018    No results found for: TSH     Component Value Date/Time   CHOL 168 03/17/2018 0809   HDL 37.20 (L) 03/17/2018 0809   CHOLHDL 5 03/17/2018 0809   VLDL 42.8 (H) 03/17/2018 0809   LDLCALC 81 03/24/2017 0926    Lab Results  Component Value Date   AST 48 (H) 05/25/2018   Lab Results  Component Value Date   ALT 68 (H) 05/25/2018   No components found for: ALKALINEPHOPHATASE No components found for: BILIRUBINTOTAL  No results found   for: ESTRADIOL  Urinalysis 11-30 RBC's    I have reviewed the labs.   Pertinent Imaging: CLINICAL DATA:  47 y/o M; left lower abdominal pain with nausea and vomiting.  EXAM: ABDOMEN - 1 VIEW  COMPARISON:  01/20/2017 CT abdomen and pelvis.  FINDINGS: The bowel gas pattern is normal. 5 mm density to left of spine at the L3-4 level may represent a proximal ureter stone.  IMPRESSION: 5 mm density to left of spine at the L3-4 level may represent a proximal ureter stone.   Electronically Signed   By: Lance  Furusawa-Stratton M.D.   On: 05/25/2018 22:37  I have independently reviewed the films.    Assessment & Plan:    1. Left ureteral stone Explained to patient the need for CT renal stone study to confirm position and present of stone - see below Explained to the patient that he could continue to push fluids and take tamsulosin and effort to pass the stone on his own, but he states he is at a point where he would like to disc at rid of it.  We discussed ESWL and ureteroscopy.  He would like to pursue ESWL.  I did explain to him that it has a lower success rate than ureteroscopy and that if he fails ESWL he may still need to undergo ureteroscopy with a stent placement. I explained that ESWL  is a means of pulverizing urinary stones without surgery using shockwave therapy I discussed the risks involved with ESWL consist of bruising to the skin and kidney region as a result of the shockwave, possibility of long-term kidney damage, development of high blood pressure and damage to the bowel or long, hematuria, urinary bleeding serious enough to require transfusion or surgical repair or removal of the kidney is rare, rare chance of hematoma formation in the kidney and injuries to the spleen, liver or pancreas.  There is also the risk of urinary tract infection or any infection of the blood system or tissue.   There is also a possibility of resultant damage to male organs. I explained that sometimes the stone fragments can stack up in the ureter like coins, a phenomenon described as "Steinstrasse", that would result in a stent placement and/or URS for further treatment I informed the patient that IV sedation is typically used on the truck, but it some rare instances we need to use general anesthesia - the risks being infection, irregular heart beat, irregular BP, stroke, MI, CVA, paralysis, coma and/or death. Patient to be scheduled for left ESWL   2. Left hydronephrosis Obtain RUS to ensure the hydronephrosis has resolved once they have passed and/or recovered from procedure to ensure to iatrogenic hydronephrosis remains - it is explained to the patient that it is important to document resolution of the hydronephrosis as "silent hydronephrosis" can occur and cause damage and/or loss of the kidney  3. Microscopic hematuria UA today demonstrates 11-30 RBC's Continue to monitor the patient's UA after the treatment/passage of the stone to ensure the hematuria has resolved If hematuria persists, we will pursue a hematuria workup with CT Urogram and cystoscopy if appropriate.  Return for left ESWL .  These notes generated with voice recognition software. I apologize for typographical  errors.  Murdock Jellison, PA-C  [facility] [address]  [address] [place], [place] [address] [phone]  Addendum CLINICAL DATA:  Left-sided flank pain for 1 week. Microscopic hematuria. Nausea and dysuria.  EXAM: CT ABDOMEN AND PELVIS WITHOUT CONTRAST  TECHNIQUE:   Multidetector CT imaging of the abdomen and pelvis was performed following the standard protocol without IV contrast.  COMPARISON:  01/20/2017  FINDINGS: Lower chest: No acute findings. Tiny less than 5 mm nodules in both lung bases are stable, consistent with benign etiology.  Hepatobiliary: No mass visualized on this unenhanced exam. Mild diffuse hepatic steatosis. Gallbladder is unremarkable.  Pancreas: No mass or inflammatory process visualized on this unenhanced exam.  Spleen:  Within normal limits in size.  Adrenals/Urinary tract: Mild left hydroureteronephrosis and perinephric stranding is seen due to a 4 mm calculus in the distal left ureter. No other urinary tract calculi identified. Unremarkable unopacified urinary bladder.  Stomach/Bowel: No evidence of obstruction, inflammatory process, or abnormal fluid collections.  Vascular/Lymphatic: No pathologically enlarged lymph nodes identified. No evidence of abdominal aortic aneurysm.  Reproductive:  No mass or other significant abnormality.  Other:  None.  Musculoskeletal:  No suspicious bone lesions identified.  IMPRESSION: Mild left hydroureteronephrosis due to 4 mm calculus in distal left ureter.   Electronically Signed   By: John  Stahl M.D.   On: 06/01/2018 12:33   

## 2018-05-31 ENCOUNTER — Encounter: Payer: Self-pay | Admitting: Urology

## 2018-05-31 ENCOUNTER — Ambulatory Visit: Payer: Medicare HMO | Admitting: Urology

## 2018-05-31 VITALS — BP 116/87 | HR 106 | Ht 78.0 in | Wt 323.5 lb

## 2018-05-31 DIAGNOSIS — N201 Calculus of ureter: Secondary | ICD-10-CM

## 2018-05-31 DIAGNOSIS — R3129 Other microscopic hematuria: Secondary | ICD-10-CM | POA: Diagnosis not present

## 2018-05-31 LAB — URINALYSIS, COMPLETE
Bilirubin, UA: NEGATIVE
Glucose, UA: NEGATIVE
KETONES UA: NEGATIVE
LEUKOCYTES UA: NEGATIVE
Nitrite, UA: NEGATIVE
SPEC GRAV UA: 1.02 (ref 1.005–1.030)
Urobilinogen, Ur: 0.2 mg/dL (ref 0.2–1.0)
pH, UA: 5 (ref 5.0–7.5)

## 2018-05-31 LAB — MICROSCOPIC EXAMINATION

## 2018-05-31 MED ORDER — HYDROCODONE-ACETAMINOPHEN 5-325 MG PO TABS: 1.0000 | ORAL_TABLET | Freq: Three times a day (TID) | ORAL | 0 refills | Status: DC

## 2018-06-01 ENCOUNTER — Other Ambulatory Visit: Payer: Self-pay | Admitting: Radiology

## 2018-06-01 ENCOUNTER — Ambulatory Visit
Admission: RE | Admit: 2018-06-01 | Discharge: 2018-06-01 | Disposition: A | Discharge status: Home / Self Care | Payer: Medicare HMO | Source: Ambulatory Visit | Attending: Urology | Admitting: Urology

## 2018-06-01 DIAGNOSIS — R3129 Other microscopic hematuria: Secondary | ICD-10-CM | POA: Diagnosis present

## 2018-06-01 DIAGNOSIS — N132 Hydronephrosis with renal and ureteral calculous obstruction: Secondary | ICD-10-CM | POA: Diagnosis not present

## 2018-06-01 DIAGNOSIS — N201 Calculus of ureter: Secondary | ICD-10-CM

## 2018-06-02 LAB — CULTURE, URINE COMPREHENSIVE

## 2018-06-02 MED ORDER — CIPROFLOXACIN HCL 500 MG PO TABS
500.0000 mg | ORAL_TABLET | ORAL | Status: AC
  Administered 2018-06-03: 500 mg via ORAL

## 2018-06-03 ENCOUNTER — Encounter: Payer: Self-pay | Admitting: *Deleted

## 2018-06-03 ENCOUNTER — Ambulatory Visit
Admission: RE | Admit: 2018-06-03 | Discharge: 2018-06-03 | Disposition: A | Discharge status: Home / Self Care | Payer: Medicare HMO | Source: Ambulatory Visit | Attending: Urology | Admitting: Urology

## 2018-06-03 ENCOUNTER — Encounter
Admission: RE | Disposition: A | Discharge status: Home / Self Care | Payer: Self-pay | Source: Ambulatory Visit | Attending: Urology

## 2018-06-03 ENCOUNTER — Ambulatory Visit: Payer: Medicare HMO

## 2018-06-03 DIAGNOSIS — N132 Hydronephrosis with renal and ureteral calculous obstruction: Secondary | ICD-10-CM | POA: Insufficient documentation

## 2018-06-03 DIAGNOSIS — E785 Hyperlipidemia, unspecified: Secondary | ICD-10-CM | POA: Diagnosis not present

## 2018-06-03 DIAGNOSIS — N201 Calculus of ureter: Secondary | ICD-10-CM

## 2018-06-03 DIAGNOSIS — Z79891 Long term (current) use of opiate analgesic: Secondary | ICD-10-CM | POA: Insufficient documentation

## 2018-06-03 DIAGNOSIS — F17200 Nicotine dependence, unspecified, uncomplicated: Secondary | ICD-10-CM | POA: Diagnosis not present

## 2018-06-03 DIAGNOSIS — N200 Calculus of kidney: Secondary | ICD-10-CM | POA: Diagnosis present

## 2018-06-03 DIAGNOSIS — Z79899 Other long term (current) drug therapy: Secondary | ICD-10-CM | POA: Insufficient documentation

## 2018-06-03 DIAGNOSIS — E119 Type 2 diabetes mellitus without complications: Secondary | ICD-10-CM | POA: Insufficient documentation

## 2018-06-03 DIAGNOSIS — Z86711 Personal history of pulmonary embolism: Secondary | ICD-10-CM | POA: Insufficient documentation

## 2018-06-03 DIAGNOSIS — I10 Essential (primary) hypertension: Secondary | ICD-10-CM | POA: Insufficient documentation

## 2018-06-03 DIAGNOSIS — Z7984 Long term (current) use of oral hypoglycemic drugs: Secondary | ICD-10-CM | POA: Insufficient documentation

## 2018-06-03 DIAGNOSIS — J45909 Unspecified asthma, uncomplicated: Secondary | ICD-10-CM | POA: Diagnosis not present

## 2018-06-03 DIAGNOSIS — Z86718 Personal history of other venous thrombosis and embolism: Secondary | ICD-10-CM | POA: Diagnosis not present

## 2018-06-03 DIAGNOSIS — F329 Major depressive disorder, single episode, unspecified: Secondary | ICD-10-CM | POA: Insufficient documentation

## 2018-06-03 DIAGNOSIS — M109 Gout, unspecified: Secondary | ICD-10-CM | POA: Diagnosis not present

## 2018-06-03 DIAGNOSIS — Z7982 Long term (current) use of aspirin: Secondary | ICD-10-CM | POA: Diagnosis not present

## 2018-06-03 DIAGNOSIS — Z6837 Body mass index (BMI) 37.0-37.9, adult: Secondary | ICD-10-CM | POA: Diagnosis not present

## 2018-06-03 HISTORY — PX: EXTRACORPOREAL SHOCK WAVE LITHOTRIPSY: SHX1557

## 2018-06-03 LAB — GLUCOSE, CAPILLARY: Glucose-Capillary: 168 mg/dL — ABNORMAL HIGH (ref 70–99)

## 2018-06-03 SURGERY — LITHOTRIPSY, ESWL
Anesthesia: Moderate Sedation | Laterality: Left

## 2018-06-03 MED ORDER — ONDANSETRON HCL 4 MG/2ML IJ SOLN
4.0000 mg | Freq: Once | INTRAMUSCULAR | Status: AC | PRN
  Administered 2018-06-03: 4 mg via INTRAVENOUS

## 2018-06-03 MED ORDER — DIAZEPAM 5 MG PO TABS
ORAL_TABLET | ORAL | Status: AC
  Filled 2018-06-03: qty 2

## 2018-06-03 MED ORDER — CIPROFLOXACIN HCL 500 MG PO TABS
ORAL_TABLET | ORAL | Status: AC
  Filled 2018-06-03: qty 1

## 2018-06-03 MED ORDER — DIAZEPAM 5 MG PO TABS
10.0000 mg | ORAL_TABLET | ORAL | Status: AC
  Administered 2018-06-03: 10 mg via ORAL

## 2018-06-03 MED ORDER — DIPHENHYDRAMINE HCL 25 MG PO CAPS
ORAL_CAPSULE | ORAL | Status: AC
  Filled 2018-06-03: qty 1

## 2018-06-03 MED ORDER — HYDROCODONE-ACETAMINOPHEN 5-325 MG PO TABS: 1.0000 | ORAL_TABLET | Freq: Three times a day (TID) | ORAL | 0 refills | Status: DC

## 2018-06-03 MED ORDER — ONDANSETRON HCL 4 MG/2ML IJ SOLN
INTRAMUSCULAR | Status: AC
  Administered 2018-06-03: 4 mg via INTRAVENOUS
  Filled 2018-06-03: qty 2

## 2018-06-03 MED ORDER — DIPHENHYDRAMINE HCL 25 MG PO CAPS
25.0000 mg | ORAL_CAPSULE | ORAL | Status: AC
  Administered 2018-06-03: 25 mg via ORAL

## 2018-06-03 MED ORDER — SODIUM CHLORIDE 0.9 % IV SOLN
INTRAVENOUS | Status: DC
  Administered 2018-06-03: 08:00:00 via INTRAVENOUS

## 2018-06-03 NOTE — Interval H&P Note (Signed)
History and Physical Interval Note:  06/03/2018 10:51 AM  Nathan Krause  has presented today for surgery, with the diagnosis of Kidney Stone  The various methods of treatment have been discussed with the patient and family. After consideration of risks, benefits and other options for treatment, the patient has consented to  Procedure(s): EXTRACORPOREAL SHOCK WAVE LITHOTRIPSY (ESWL) (Left) as a surgical intervention .  The patient's history has been reviewed, patient examined, no change in status, stable for surgery.  I have reviewed the patient's chart and labs.  Questions were answered to the patient's satisfaction.     Vanna ScotlandAshley Arleta Ostrum

## 2018-06-03 NOTE — Discharge Instructions (Addendum)
AMBULATORY SURGERY  DISCHARGE INSTRUCTIONS   1) The drugs that you were given will stay in your system until tomorrow so for the next 24 hours you should not:  A) Drive an automobile B) Make any legal decisions C) Drink any alcoholic beverage   2) You may resume regular meals tomorrow.  Today it is better to start with liquids and gradually work up to solid foods.  You may eat anything you prefer, but it is better to start with liquids, then soup and crackers, and gradually work up to solid foods.   3) Please notify your doctor immediately if you have any unusual bleeding, trouble breathing, redness and pain at the surgery site, drainage, fever, or pain not relieved by medication.    4) Additional Instructions:   Please contact your physician with any problems or Same Day Surgery at [phone], Monday through Friday 6 am to 4 pm, or [facility] number at [phone].See [facility] discharge instructions in chart.  

## 2018-06-11 ENCOUNTER — Ambulatory Visit (INDEPENDENT_AMBULATORY_CARE_PROVIDER_SITE_OTHER): Payer: Medicare HMO

## 2018-06-11 DIAGNOSIS — R3129 Other microscopic hematuria: Secondary | ICD-10-CM

## 2018-06-11 DIAGNOSIS — N201 Calculus of ureter: Secondary | ICD-10-CM

## 2018-06-11 DIAGNOSIS — R300 Dysuria: Secondary | ICD-10-CM

## 2018-06-11 LAB — URINALYSIS, COMPLETE
BILIRUBIN UA: NEGATIVE
GLUCOSE, UA: NEGATIVE
Ketones, UA: NEGATIVE
LEUKOCYTES UA: NEGATIVE
Nitrite, UA: NEGATIVE
SPEC GRAV UA: 1.015 (ref 1.005–1.030)
Urobilinogen, Ur: 0.2 mg/dL (ref 0.2–1.0)
pH, UA: 5.5 (ref 5.0–7.5)

## 2018-06-11 LAB — MICROSCOPIC EXAMINATION
Bacteria, UA: NONE SEEN
Epithelial Cells (non renal): NONE SEEN /hpf (ref 0–10)

## 2018-06-11 LAB — BLADDER SCAN AMB NON-IMAGING

## 2018-06-11 NOTE — Progress Notes (Signed)
Pt presents in office due to dysuria, back and flank pain, as well as urinary frequency. Pt states that he awoke this morning around 4am and was unable to urinate, has been trying to void since then, but has been unsuccessful. Pt's PVR is 93mL. Pt able to urinate fine in clinic. Pt denies fevers or chills. Temp in clinic today is 98.5. Pt's UA was evaluated by Dr.Brandon who states it is clear. Pt given verbal reassurance.

## 2018-06-14 LAB — CULTURE, URINE COMPREHENSIVE

## 2018-06-16 NOTE — Progress Notes (Signed)
06/17/2018 3:05 PM   Nathan Krause 19-Jul-1971 [id number]  Referring provider: Pleas Koch, NP [place], [place] [address]  Chief Complaint  Patient presents with  . Nephrolithiasis    2wk post litho    HPI: Patient is a 47 year old Caucasian male with a history of a left ureteral stone who underwent ESWL.  CT Renal stone study on 06/01/2018 noted mild left hydroureteronephrosis due to 4 mm calculus in distal left ureter.  He underwent left ESWL on 06/03/2018.  His postprocedural course was as expected and uneventful.  KUB 06/17/2018   Prior urological history:  Hx of stones.  Spontaneously passed.    His complaint today is he is having some dysuria.  He actually came in last week for UA and urine culture.  UA was negative and urine culture was negative as well.  Patient denies any gross hematuria, dysuria or suprapubic/flank pain.  Patient denies any fevers, chills, nausea or vomiting.   He has not passed any fragments.  KUB 06/17/2018 Slight progression of the previously seen distal left ureteral stone, now either at the UVJ or within the bladder   PMH: Past Medical History:  Diagnosis Date  . Anxiety and depression   . Asthma   . DVT (deep venous thrombosis) ([place])   . Essential hypertension   . Gout   . Hyperlipidemia   . Pulmonary embolism ([place])   . Rheumatoid arthritis ([place])   . Type 2 diabetes mellitus without complication [facility])     Surgical History: Past Surgical History:  Procedure Laterality Date  . EXTRACORPOREAL SHOCK WAVE LITHOTRIPSY Left 06/03/2018   Procedure: EXTRACORPOREAL SHOCK WAVE LITHOTRIPSY (ESWL);  Surgeon: Hollice Espy, MD;  Location: [facility] ORS;  Service: Urology;  Laterality: Left;  . KNEE ARTHROSCOPY Right   . KNEE SURGERY Left     Home Medications:  Allergies as of 06/17/2018      Reactions   Oxycodone-acetaminophen Nausea Only      Medication List        Accurate as of 06/17/18  3:05 PM. Always use your  most recent med list.          albuterol 108 (90 Base) MCG/ACT inhaler Commonly known as:  PROVENTIL HFA;VENTOLIN HFA Inhale 2 puffs into the lungs every 4 (four) hours as needed for wheezing or shortness of breath.   aspirin 81 MG tablet Take 81 mg by mouth daily.   citalopram 20 MG tablet Commonly known as:  CELEXA Take 1 tablet (20 mg total) by mouth daily.   colchicine 0.6 MG tablet Take one pill by mouth up to three times daily with food as needed for gout pain   Glucosamine 500 MG Caps Take 1,000 mg by mouth daily.   HYDROCHLOROTHIAZIDE PO Take by mouth.   HYDROcodone-acetaminophen 5-325 MG tablet Commonly known as:  NORCO/VICODIN Take 1 tablet by mouth every 6 (six) hours as needed for moderate pain.   lisinopril 10 MG tablet Commonly known as:  PRINIVIL,ZESTRIL Take 1 tablet by mouth once daily for blood pressure.   metFORMIN 1000 MG tablet Commonly known as:  GLUCOPHAGE TAKE 1 TABLET (1,000 MG TOTAL) BY MOUTH 2 (TWO) TIMES DAILY WITH MEALS.   multivitamin capsule Take 1 capsule by mouth daily.   simvastatin 40 MG tablet Commonly known as:  ZOCOR Take 1 tablet by mouth every evening for cholesterol.   tamsulosin 0.4 MG Caps capsule Commonly known as:  FLOMAX Take 1 capsule (0.4 mg total) by  mouth daily.       Allergies:  Allergies  Allergen Reactions  . Oxycodone-Acetaminophen Nausea Only    Family History: Family History  Problem Relation Age of Onset  . Heart disease Father   . Heart attack Father 45       deceased  . Colon cancer Mother 71  . Breast cancer Mother     Social History:  reports that he has never smoked. He has never used smokeless tobacco. He reports that he drinks alcohol. He reports that he does not use drugs.  ROS: UROLOGY Frequent Urination?: No Hard to postpone urination?: No Burning/pain with urination?: Yes Get up at night to urinate?: No Leakage of urine?: No Urine stream starts and stops?: No Trouble  starting stream?: No Do you have to strain to urinate?: No Blood in urine?: No Urinary tract infection?: No Sexually transmitted disease?: No Injury to kidneys or bladder?: No Painful intercourse?: No Weak stream?: No Erection problems?: No Penile pain?: No  Gastrointestinal Nausea?: No Vomiting?: No Indigestion/heartburn?: No Diarrhea?: No Constipation?: No  Constitutional Fever: No Night sweats?: No Weight loss?: No Fatigue?: No  Skin Skin rash/lesions?: No Itching?: No  Eyes Blurred vision?: No Double vision?: No  Ears/Nose/Throat Sore throat?: No Sinus problems?: No  Hematologic/Lymphatic Swollen glands?: No Easy bruising?: No  Cardiovascular Leg swelling?: No Chest pain?: No  Respiratory Cough?: No Shortness of breath?: No  Endocrine Excessive thirst?: No  Musculoskeletal Back pain?: Yes Joint pain?: No  Neurological Headaches?: No Dizziness?: No  Psychologic Depression?: No Anxiety?: No  Physical Exam: BP (!) 85/55   Pulse (!) 125   Ht '6\' 6"'  (1.981 m)   Wt (!) 320 lb (145.2 kg)   BMI 36.98 kg/m   Constitutional: Well nourished. Alert and oriented, No acute distress. HEENT: [place] AT, moist mucus membranes. Trachea midline, no masses. Cardiovascular: No clubbing, cyanosis, or edema. Respiratory: Normal respiratory effort, no increased work of breathing. Skin: No rashes, bruises or suspicious lesions. Lymph: No cervical or inguinal adenopathy. Neurologic: Grossly intact, no focal deficits, moving all 4 extremities. Psychiatric: Normal mood and affect.  Laboratory Data: Lab Results  Component Value Date   WBC 7.1 05/25/2018   HGB 14.9 05/25/2018   HCT 42.9 05/25/2018   MCV 88.1 05/25/2018   PLT 242 05/25/2018    Lab Results  Component Value Date   CREATININE 1.12 05/25/2018    No results found for: PSA  No results found for: TESTOSTERONE  Lab Results  Component Value Date   HGBA1C 7.8 (H) 03/17/2018    No results  found for: TSH     Component Value Date/Time   CHOL 168 03/17/2018 0809   HDL 37.20 (L) 03/17/2018 0809   CHOLHDL 5 03/17/2018 0809   VLDL 42.8 (H) 03/17/2018 0809   LDLCALC 81 03/24/2017 0926    Lab Results  Component Value Date   AST 48 (H) 05/25/2018   Lab Results  Component Value Date   ALT 68 (H) 05/25/2018   No components found for: ALKALINEPHOPHATASE No components found for: BILIRUBINTOTAL  No results found for: ESTRADIOL  I have reviewed the labs.   Pertinent Imaging: CLINICAL DATA:  Follow-up left-sided kidney stone.  EXAM: ABDOMEN - 1 VIEW  COMPARISON:  06/03/2018  FINDINGS: Bowel gas pattern is normal. No significant bone finding. No stones seen overlying either kidney. Calcification previously seen in the distal left ureter now projects either at the ureterovesical junction or within the bladder. This measures 3-4 mm in size.  IMPRESSION: Slight progression of the previously seen distal left ureteral stone, now either at the UVJ or within the bladder.   Electronically Signed   By: Nelson Chimes M.D.   On: 06/17/2018 14:48 I have independently reviewed the films.    Assessment & Plan:    1. Left ureteral stone Located at the UVJ today's KUB Patient would like to continue MET I have given him refills on his tamsulosin and his Vicodin He is reminded not to take the Vicodin more than as prescribed as it is a narcotic and can be habit-forming and cause respiratory depression.  He is also not to mix alcohol, drive, operate heavy machinery or make important decisions while taking Vicodin  2. Left hydronephrosis Obtain RUS to ensure the hydronephrosis has resolved once they have passed and/or recovered from procedure to ensure to iatrogenic hydronephrosis remains - it is explained to the patient that it is important to document resolution of the hydronephrosis as "silent hydronephrosis" can occur and cause damage and/or loss of the kidney  3.  Microscopic hematuria UA on 06/11/2018 was negative for hematuria  Continue to monitor the patient's UA after the treatment/passage of the stone to ensure the hematuria has resolved If hematuria persists, we will pursue a hematuria workup with CT Urogram and cystoscopy if appropriate.  Return in about 2 weeks (around 07/01/2018) for KUB and office visit .  These notes generated with voice recognition software. I apologize for typographical errors.  Zara Council, PA-C  [facility] [address]  [place], [place] [address] [phone]

## 2018-06-17 ENCOUNTER — Ambulatory Visit
Admission: RE | Admit: 2018-06-17 | Discharge: 2018-06-17 | Disposition: A | Discharge status: Home / Self Care | Payer: Medicare HMO | Source: Ambulatory Visit | Attending: Urology | Admitting: Urology

## 2018-06-17 ENCOUNTER — Other Ambulatory Visit: Payer: Self-pay | Admitting: Primary Care

## 2018-06-17 ENCOUNTER — Encounter: Payer: Self-pay | Admitting: Urology

## 2018-06-17 ENCOUNTER — Ambulatory Visit (INDEPENDENT_AMBULATORY_CARE_PROVIDER_SITE_OTHER): Payer: Medicare HMO | Admitting: Urology

## 2018-06-17 VITALS — BP 85/55 | HR 125 | Ht 78.0 in | Wt 320.0 lb

## 2018-06-17 DIAGNOSIS — N201 Calculus of ureter: Secondary | ICD-10-CM | POA: Insufficient documentation

## 2018-06-17 DIAGNOSIS — N132 Hydronephrosis with renal and ureteral calculous obstruction: Secondary | ICD-10-CM

## 2018-06-17 DIAGNOSIS — R3129 Other microscopic hematuria: Secondary | ICD-10-CM

## 2018-06-17 DIAGNOSIS — M1A9XX0 Chronic gout, unspecified, without tophus (tophi): Secondary | ICD-10-CM

## 2018-06-17 MED ORDER — HYDROCODONE-ACETAMINOPHEN 5-325 MG PO TABS: 1.0000 | ORAL_TABLET | Freq: Four times a day (QID) | ORAL | 0 refills | Status: DC | PRN

## 2018-06-17 MED ORDER — TAMSULOSIN HCL 0.4 MG PO CAPS: 0.4000 mg | ORAL_CAPSULE | Freq: Every day | ORAL | 3 refills | Status: DC

## 2018-06-17 NOTE — Telephone Encounter (Signed)
Received faxed refill request for colchicine 0.6 MG tablet  Last prescribed by Dr Milinda Antisower and seen on 05/11/2018

## 2018-06-17 NOTE — Addendum Note (Signed)
Addended by: Martha ClanWATTS, Veyda Kaufman M on: 06/17/2018 04:26 PM   Modules accepted: Orders

## 2018-06-18 MED ORDER — COLCHICINE 0.6 MG PO TABS: ORAL_TABLET | ORAL | 0 refills | Status: DC

## 2018-06-18 NOTE — Telephone Encounter (Signed)
Pt called to f/u on request stating he is out of medication and having a gout flare. Please advise.  [id number] [place] - [phone] [address]

## 2018-06-18 NOTE — Telephone Encounter (Signed)
Noted, will send refill to pharmacy. Please have patient follow up with me if no improvement and/or if he encounters another flare.

## 2018-06-22 NOTE — Telephone Encounter (Signed)
Patient notified as instructed and verbalized understanding. 

## 2018-06-23 ENCOUNTER — Other Ambulatory Visit: Payer: Medicare HMO

## 2018-06-23 ENCOUNTER — Ambulatory Visit: Payer: Medicare HMO | Admitting: Primary Care

## 2018-06-24 ENCOUNTER — Other Ambulatory Visit: Payer: Medicare HMO

## 2018-06-25 ENCOUNTER — Telehealth: Payer: Self-pay | Admitting: Primary Care

## 2018-06-25 NOTE — Telephone Encounter (Signed)
Copied from CRM [phone]. Topic: Quick Communication - Rx Refill/Question >> Jun 25, 2018 10:26 AM Alexander BergeronBarksdale, Harvey B wrote: Medication: colchicine 0.6 MG tablet [[id number]][[id number]]  Pt called b/c the pharmacy has not received the medication above; contact pharmacy or pt if needed

## 2018-06-25 NOTE — Telephone Encounter (Addendum)
Spoken to patient. [facility] and was told that they were waiting for patient to call them to confirm about medication.

## 2018-07-06 ENCOUNTER — Ambulatory Visit
Admission: RE | Admit: 2018-07-06 | Discharge: 2018-07-06 | Disposition: A | Discharge status: Home / Self Care | Payer: Medicare HMO | Source: Ambulatory Visit | Attending: Urology | Admitting: Urology

## 2018-07-06 ENCOUNTER — Ambulatory Visit (INDEPENDENT_AMBULATORY_CARE_PROVIDER_SITE_OTHER): Payer: Medicare HMO | Admitting: Urology

## 2018-07-06 ENCOUNTER — Encounter: Payer: Self-pay | Admitting: Urology

## 2018-07-06 ENCOUNTER — Other Ambulatory Visit: Payer: Self-pay | Admitting: Radiology

## 2018-07-06 VITALS — BP 129/95 | HR 114 | Ht 78.0 in | Wt 318.0 lb

## 2018-07-06 DIAGNOSIS — N201 Calculus of ureter: Secondary | ICD-10-CM | POA: Diagnosis not present

## 2018-07-06 DIAGNOSIS — N132 Hydronephrosis with renal and ureteral calculous obstruction: Secondary | ICD-10-CM | POA: Diagnosis not present

## 2018-07-06 NOTE — Progress Notes (Signed)
07/06/2018 1:54 PM   Zenovia Jordan July 27, 1971 [id number]  Referring provider: Pleas Koch, NP [place],  [address]  Chief Complaint  Patient presents with  . Nephrolithiasis    HPI: Patient is a 47 year old Caucasian male with a history of left ureteral stone who presents today for KUB follow-up.    Background history Patient is a 47 year old Caucasian male with a history of a left ureteral stone who underwent ESWL.  CT Renal stone study on 06/01/2018 noted mild left hydroureteronephrosis due to 4 mm calculus in distal left ureter.  He underwent left ESWL on 06/03/2018.  His postprocedural course was as expected and uneventful.  KUB 06/17/2018.  Prior urological history:  Hx of stones.  Spontaneously passed.  He has not passed any fragments. KUB 06/17/2018 Slight progression of the previously seen distal left ureteral stone, now either at the UVJ or within the bladder  He is having very mild intermittent left flank pain.  He has not needed to take any narcotic pain medicine for the pain.  He states that he still has plenty of Flomax and Vicodin on hand.  He is taking Flomax daily.  He has not passed a fragment.  Patient denies any gross hematuria, dysuria or suprapubic/flank pain.  Patient denies any fevers, chills, nausea or vomiting.   KUB today (07/06/2018) revealed a distal ureteral calculi not excluded.    PMH: Past Medical History:  Diagnosis Date  . Anxiety and depression   . Asthma   . DVT (deep venous thrombosis) ([place])   . Essential hypertension   . Gout   . Hyperlipidemia   . Pulmonary embolism ([place])   . Rheumatoid arthritis ([place])   . Type 2 diabetes mellitus without complication [facility])     Surgical History: Past Surgical History:  Procedure Laterality Date  . EXTRACORPOREAL SHOCK WAVE LITHOTRIPSY Left 06/03/2018   Procedure: EXTRACORPOREAL SHOCK WAVE LITHOTRIPSY (ESWL);  Surgeon: Hollice Espy, MD;  Location: [facility] ORS;  Service:  Urology;  Laterality: Left;  . KNEE ARTHROSCOPY Right   . KNEE SURGERY Left     Home Medications:  Allergies as of 07/06/2018      Reactions   Oxycodone-acetaminophen Nausea Only      Medication List        Accurate as of 07/06/18  1:54 PM. Always use your most recent med list.          albuterol 108 (90 Base) MCG/ACT inhaler Commonly known as:  PROVENTIL HFA;VENTOLIN HFA Inhale 2 puffs into the lungs every 4 (four) hours as needed for wheezing or shortness of breath.   aspirin 81 MG tablet Take 81 mg by mouth daily.   citalopram 20 MG tablet Commonly known as:  CELEXA Take 1 tablet (20 mg total) by mouth daily.   colchicine 0.6 MG tablet Take one pill by mouth up to three times daily with food as needed for gout pain   Glucosamine 500 MG Caps Take 1,000 mg by mouth daily.   HYDROCHLOROTHIAZIDE PO Take by mouth.   HYDROcodone-acetaminophen 5-325 MG tablet Commonly known as:  NORCO/VICODIN Take 1 tablet by mouth every 6 (six) hours as needed for moderate pain.   lisinopril 10 MG tablet Commonly known as:  PRINIVIL,ZESTRIL Take 1 tablet by mouth once daily for blood pressure.   metFORMIN 1000 MG tablet Commonly known as:  GLUCOPHAGE TAKE 1 TABLET (1,000 MG TOTAL) BY MOUTH 2 (TWO) TIMES DAILY WITH MEALS.   multivitamin capsule Take  1 capsule by mouth daily.   simvastatin 40 MG tablet Commonly known as:  ZOCOR Take 1 tablet by mouth every evening for cholesterol.   tamsulosin 0.4 MG Caps capsule Commonly known as:  FLOMAX Take 1 capsule (0.4 mg total) by mouth daily.       Allergies:  Allergies  Allergen Reactions  . Oxycodone-Acetaminophen Nausea Only    Family History: Family History  Problem Relation Age of Onset  . Heart disease Father   . Heart attack Father 68       deceased  . Colon cancer Mother 62  . Breast cancer Mother     Social History:  reports that he has never smoked. He has never used smokeless tobacco. He reports that he  drinks alcohol. He reports that he does not use drugs.  ROS: UROLOGY Frequent Urination?: No Hard to postpone urination?: No Burning/pain with urination?: No Get up at night to urinate?: No Leakage of urine?: No Urine stream starts and stops?: No Trouble starting stream?: No Do you have to strain to urinate?: No Blood in urine?: No Urinary tract infection?: No Sexually transmitted disease?: No Injury to kidneys or bladder?: No Painful intercourse?: No Weak stream?: No Erection problems?: No Penile pain?: No  Gastrointestinal Nausea?: No Vomiting?: No Indigestion/heartburn?: No Diarrhea?: No Constipation?: No  Constitutional Fever: No Night sweats?: No Weight loss?: No Fatigue?: No  Skin Skin rash/lesions?: No Itching?: No  Eyes Blurred vision?: No Double vision?: No  Ears/Nose/Throat Sore throat?: No Sinus problems?: Yes  Hematologic/Lymphatic Swollen glands?: No Easy bruising?: No  Cardiovascular Leg swelling?: No Chest pain?: No  Respiratory Cough?: No Shortness of breath?: No  Endocrine Excessive thirst?: No  Musculoskeletal Back pain?: Yes Joint pain?: Yes  Neurological Headaches?: No Dizziness?: No  Psychologic Depression?: No Anxiety?: No  Physical Exam: BP (!) 129/95 (BP Location: Left Arm, Patient Position: Sitting, Cuff Size: Large)   Pulse (!) 114   Ht _0  (1.981 m)   Wt (!) 318 lb (144.2 kg)   BMI 36.75 kg/m   Constitutional: Well nourished. Alert and oriented, No acute distress. HEENT: [place] AT, moist mucus membranes. Trachea midline, no masses. Cardiovascular: No clubbing, cyanosis, or edema. Respiratory: Normal respiratory effort, no increased work of breathing. Skin: No rashes, bruises or suspicious lesions. Lymph: No cervical or inguinal adenopathy. Neurologic: Grossly intact, no focal deficits, moving all 4 extremities. Psychiatric: Normal mood and affect.  Laboratory Data: Lab Results  Component Value Date     WBC 7.1 05/25/2018   HGB 14.9 05/25/2018   HCT 42.9 05/25/2018   MCV 88.1 05/25/2018   PLT 242 05/25/2018    Lab Results  Component Value Date   CREATININE 1.12 05/25/2018    No results found for: PSA  No results found for: TESTOSTERONE  Lab Results  Component Value Date   HGBA1C 7.8 (H) 03/17/2018    No results found for: TSH     Component Value Date/Time   CHOL 168 03/17/2018 0809   HDL 37.20 (L) 03/17/2018 0809   CHOLHDL 5 03/17/2018 0809   VLDL 42.8 (H) 03/17/2018 0809   LDLCALC 81 03/24/2017 0926    Lab Results  Component Value Date   AST 48 (H) 05/25/2018   Lab Results  Component Value Date   ALT 68 (H) 05/25/2018   No components found for: ALKALINEPHOPHATASE No components found for: BILIRUBINTOTAL  No results found for: ESTRADIOL  I have reviewed the labs.   Pertinent Imaging: CLINICAL DATA:  47 year old  male. Left kidney stone. Post lithotripsy 06/03/2018. Mild occasional left flank pain. Subsequent encounter.  EXAM: ABDOMEN - 1 VIEW  COMPARISON:  06/17/2018 plain film exam.  06/01/2018 CT.  FINDINGS: 5 mm rounded calcification left aspect of pelvis noted. This appears slightly larger than on the prior plain film examination when this measured 4 mm. Etiology indeterminate. On prior CT, no corresponding phleboliths at this level are noted. Distal ureteral calculi therefore not excluded. Otherwise negative exam.  IMPRESSION: 5 mm rounded calcification left aspect of the pelvis as detailed above.   Electronically Signed   By: Genia Del M.D.   On: 07/06/2018 19:51 I have independently reviewed the films.    Assessment & Plan:    1. Left ureteral stone Located at the UVJ today's KUB  Patient would like to continue MET RTC in 2 weeks for KUB and office visit  Patient is advised that if they should start to experience pain that is not able to be controlled with pain medication, intractable nausea and/or vomiting and/or  fevers greater than 103 or shaking chills to contact the office immediately or seek treatment in the emergency department for emergent intervention.    2. Left hydronephrosis Obtain RUS to ensure the hydronephrosis has resolved once they have passed and/or recovered from procedure to ensure to iatrogenic hydronephrosis remains - it is explained to the patient that it is important to document resolution of the hydronephrosis as "silent hydronephrosis" can occur and cause damage and/or loss of the kidney  3. Microscopic hematuria UA on 06/11/2018 was negative for hematuria  Continue to monitor the patient's UA after the treatment/passage of the stone to ensure the hematuria has resolved If hematuria persists, we will pursue a hematuria workup with CT Urogram and cystoscopy if appropriate.  Return in about 2 weeks (around 07/20/2018) for KUB and office .  These notes generated with voice recognition software. I apologize for typographical errors.  Zara Council, PA-C  [facility] [address]  [place], [place] [address] [phone]

## 2018-07-29 ENCOUNTER — Ambulatory Visit: Payer: Medicare HMO | Admitting: Urology

## 2018-08-06 ENCOUNTER — Ambulatory Visit
Admission: RE | Admit: 2018-08-06 | Discharge: 2018-08-06 | Disposition: A | Discharge status: Home / Self Care | Payer: Medicare HMO | Source: Ambulatory Visit | Attending: Urology | Admitting: Urology

## 2018-08-06 ENCOUNTER — Other Ambulatory Visit: Payer: Self-pay | Admitting: Urology

## 2018-08-06 ENCOUNTER — Ambulatory Visit (INDEPENDENT_AMBULATORY_CARE_PROVIDER_SITE_OTHER): Payer: Medicare HMO | Admitting: Primary Care

## 2018-08-06 ENCOUNTER — Encounter: Payer: Self-pay | Admitting: Urology

## 2018-08-06 ENCOUNTER — Ambulatory Visit: Payer: Medicare HMO | Admitting: Urology

## 2018-08-06 ENCOUNTER — Encounter: Payer: Self-pay | Admitting: Primary Care

## 2018-08-06 ENCOUNTER — Other Ambulatory Visit (INDEPENDENT_AMBULATORY_CARE_PROVIDER_SITE_OTHER): Payer: Medicare HMO

## 2018-08-06 VITALS — BP 143/99 | HR 97 | Ht 78.0 in | Wt 323.6 lb

## 2018-08-06 VITALS — BP 130/86 | HR 66 | Temp 97.8°F | Ht 78.0 in | Wt 324.0 lb

## 2018-08-06 DIAGNOSIS — E1165 Type 2 diabetes mellitus with hyperglycemia: Secondary | ICD-10-CM | POA: Diagnosis not present

## 2018-08-06 DIAGNOSIS — N201 Calculus of ureter: Secondary | ICD-10-CM

## 2018-08-06 DIAGNOSIS — N132 Hydronephrosis with renal and ureteral calculous obstruction: Secondary | ICD-10-CM

## 2018-08-06 DIAGNOSIS — R3129 Other microscopic hematuria: Secondary | ICD-10-CM

## 2018-08-06 DIAGNOSIS — G4733 Obstructive sleep apnea (adult) (pediatric): Secondary | ICD-10-CM | POA: Insufficient documentation

## 2018-08-06 DIAGNOSIS — R0683 Snoring: Secondary | ICD-10-CM | POA: Diagnosis not present

## 2018-08-06 DIAGNOSIS — R9389 Abnormal findings on diagnostic imaging of other specified body structures: Secondary | ICD-10-CM | POA: Insufficient documentation

## 2018-08-06 DIAGNOSIS — E785 Hyperlipidemia, unspecified: Secondary | ICD-10-CM

## 2018-08-06 DIAGNOSIS — IMO0001 Reserved for inherently not codable concepts without codable children: Secondary | ICD-10-CM

## 2018-08-06 DIAGNOSIS — R109 Unspecified abdominal pain: Secondary | ICD-10-CM | POA: Insufficient documentation

## 2018-08-06 LAB — LIPID PANEL
CHOL/HDL RATIO: 4
Cholesterol: 135 mg/dL (ref 0–200)
HDL: 37.4 mg/dL — AB (ref >39.00)
LDL Cholesterol: 64 mg/dL (ref 0–99)
NonHDL: 97.82
Triglycerides: 168 mg/dL — ABNORMAL HIGH (ref 0.0–149.0)
VLDL: 33.6 mg/dL (ref 0.0–40.0)

## 2018-08-06 LAB — HEMOGLOBIN A1C: HEMOGLOBIN A1C: 7.6 % — AB (ref 4.6–6.5)

## 2018-08-06 NOTE — Assessment & Plan Note (Signed)
Chronic for years, worse over last 2 years. Epworth Sleepiness Score of 7 which isn't too high. Given snoring coupled with body habitus and other co-morbidities, will send for sleep evaluation.

## 2018-08-06 NOTE — Progress Notes (Signed)
Subjective:    Patient ID: Nathan Krause, male    DOB: [birth year], 47 y.o.   MRN: [id number]  HPI  Nathan Krause is a 47 year old male with a history of hypertension, type 2 diabetes, asthma who presents today with a chief complaint of snoring.  His wife has noticed excessive, loud snoring that has been present for the last 20 years with increased symptoms over the last 2 years. He woke up one morning recently with a swollen uvula that was laying on his tongue. He endorses daytime drowsiness, he does nap nearly every morning before lunch. He's never undergone a sleep study. He has a family history of sleep apnea in his father.   Review of Systems  Respiratory: Negative for shortness of breath.        Daytime drowsiness, snoring  Cardiovascular: Negative for chest pain.       Past Medical History:  Diagnosis Date  . Anxiety and depression   . Asthma   . DVT (deep venous thrombosis) ([facility])   . Essential hypertension   . Gout   . Hyperlipidemia   . Pulmonary embolism ([facility])   . Rheumatoid arthritis ([facility])   . Type 2 diabetes mellitus without complication ([facility])      Social History   Socioeconomic History  . Marital status: Married    Spouse name: Not on file  . Number of children: Not on file  . Years of education: Not on file  . Highest education level: Not on file  Occupational History  . Occupation: Disabled  Social Needs  . Financial resource strain: Not on file  . Food insecurity:    Worry: Not on file    Inability: Not on file  . Transportation needs:    Medical: Not on file    Non-medical: Not on file  Tobacco Use  . Smoking status: Never Smoker  . Smokeless tobacco: Never Used  Substance and Sexual Activity  . Alcohol use: Yes    Alcohol/week: 0.0 standard drinks    Comment: social  . Drug use: Never  . Sexual activity: Yes    Birth control/protection: None  Lifestyle  . Physical activity:    Days per week: Not on file    Minutes per session: Not on file    . Stress: Not on file  Relationships  . Social connections:    Talks on phone: Not on file    Gets together: Not on file    Attends religious service: Not on file    Active member of club or organization: Not on file    Attends meetings of clubs or organizations: Not on file    Relationship status: Not on file  . Intimate partner violence:    Fear of current or ex partner: Not on file    Emotionally abused: Not on file    Physically abused: Not on file    Forced sexual activity: Not on file  Other Topics Concern  . Not on file  Social History Narrative   Married.   2 children.   Disabled and does not work.   Enjoys watching movies, working out, spending time with dogs, traveling.    Past Surgical History:  Procedure Laterality Date  . EXTRACORPOREAL SHOCK WAVE LITHOTRIPSY Left 06/03/2018   Procedure: EXTRACORPOREAL SHOCK WAVE LITHOTRIPSY (ESWL);  Surgeon: Vanna Scotland, MD;  Location: [facility] ORS;  Service: Urology;  Laterality: Left;  . KNEE ARTHROSCOPY Right   . KNEE SURGERY Left  Family History  Problem Relation Age of Onset  . Heart disease Father   . Heart attack Father 17       deceased  . Colon cancer Mother 62  . Breast cancer Mother     Allergies  Allergen Reactions  . Oxycodone-Acetaminophen Nausea Only    Current Outpatient Medications on File Prior to Visit  Medication Sig Dispense Refill  . albuterol (PROVENTIL HFA;VENTOLIN HFA) 108 (90 Base) MCG/ACT inhaler Inhale 2 puffs into the lungs every 4 (four) hours as needed for wheezing or shortness of breath. 1 Inhaler 0  . aspirin 81 MG tablet Take 81 mg by mouth daily.     . citalopram (CELEXA) 20 MG tablet Take 1 tablet (20 mg total) by mouth daily. 90 tablet 3  . colchicine 0.6 MG tablet Take one pill by mouth up to three times daily with food as needed for gout pain 15 tablet 0  . Glucosamine 500 MG CAPS Take 1,000 mg by mouth daily.     Marland Kitchen HYDROCHLOROTHIAZIDE PO Take by mouth.    Marland Kitchen  HYDROcodone-acetaminophen (NORCO/VICODIN) 5-325 MG tablet Take 1 tablet by mouth every 6 (six) hours as needed for moderate pain. 30 tablet 0  . lisinopril (PRINIVIL,ZESTRIL) 10 MG tablet Take 1 tablet by mouth once daily for blood pressure. 90 tablet 3  . metFORMIN (GLUCOPHAGE) 1000 MG tablet TAKE 1 TABLET (1,000 MG TOTAL) BY MOUTH 2 (TWO) TIMES DAILY WITH MEALS. 180 tablet 3  . Multiple Vitamin (MULTIVITAMIN) capsule Take 1 capsule by mouth daily.     . simvastatin (ZOCOR) 40 MG tablet Take 1 tablet by mouth every evening for cholesterol. 90 tablet 3  . tamsulosin (FLOMAX) 0.4 MG CAPS capsule Take 1 capsule (0.4 mg total) by mouth daily. 90 capsule 3   No current facility-administered medications on file prior to visit.     BP 130/86   Pulse 66   Temp 97.8 F (36.6 C) (Oral)   Ht 6\' 6"  (1.981 m)   Wt (!) 324 lb (147 kg)   SpO2 97%   BMI 37.44 kg/m    Objective:   Physical Exam  Constitutional: He appears well-nourished.  Neck: Neck supple.  Cardiovascular: Normal rate and regular rhythm.  Respiratory: Effort normal and breath sounds normal.  Skin: Skin is warm and dry.           Assessment & Plan:

## 2018-08-06 NOTE — Patient Instructions (Signed)
You will be contacted regarding your referral to pulmonology.  Please let us know if you have not been contacted within one week.   It was a pleasure to see you today!

## 2018-08-06 NOTE — Progress Notes (Signed)
08/06/2018 10:33 AM   Nathan Krause [birth year] [id number]  Referring provider: Doreene Nest, NP [address] [place], [place] [address]  Chief Complaint  Patient presents with  . Follow-up    HPI: Patient is a 47 year old Caucasian male with a history of left ureteral stone who presents today for KUB follow-up.    Background history Patient is a 47 year old Caucasian male with a history of a left ureteral stone who underwent ESWL.  CT Renal stone study on 06/01/2018 noted mild left hydroureteronephrosis due to 4 mm calculus in distal left ureter.  He underwent left ESWL on 06/03/2018.  His postprocedural course was as expected and uneventful.  KUB 06/17/2018.  Prior urological history:  Hx of stones.  Spontaneously passed.  He has not passed any fragments.  KUB 06/17/2018 Slight progression of the previously seen distal left ureteral stone, now either at the UVJ or within the bladder  KUB today (07/06/2018) revealed a distal ureteral calculi not excluded.   KUB today (08/06/2018) revealed that the stone is likely in the bladder.  He is asymptomatic at today's visit.  Patient denies any gross hematuria, dysuria or suprapubic/flank pain.  Patient denies any fevers, chills, nausea or vomiting.   PMH: Past Medical History:  Diagnosis Date  . Anxiety and depression   . Asthma   . DVT (deep venous thrombosis) (HCC)   . Essential hypertension   . Gout   . Hyperlipidemia   . Pulmonary embolism (HCC)   . Rheumatoid arthritis (HCC)   . Type 2 diabetes mellitus without complication [facility])     Surgical History: Past Surgical History:  Procedure Laterality Date  . EXTRACORPOREAL SHOCK WAVE LITHOTRIPSY Left 06/03/2018   Procedure: EXTRACORPOREAL SHOCK WAVE LITHOTRIPSY (ESWL);  Surgeon: Vanna Scotland, MD;  Location: [facility] ORS;  Service: Urology;  Laterality: Left;  . KNEE ARTHROSCOPY Right   . KNEE SURGERY Left     Home Medications:  Allergies as of 08/06/2018    Reactions   Oxycodone-acetaminophen Nausea Only      Medication List        Accurate as of 08/06/18 10:33 AM. Always use your most recent med list.          albuterol 108 (90 Base) MCG/ACT inhaler Commonly known as:  PROVENTIL HFA;VENTOLIN HFA Inhale 2 puffs into the lungs every 4 (four) hours as needed for wheezing or shortness of breath.   aspirin 81 MG tablet Take 81 mg by mouth daily.   citalopram 20 MG tablet Commonly known as:  CELEXA Take 1 tablet (20 mg total) by mouth daily.   colchicine 0.6 MG tablet Take one pill by mouth up to three times daily with food as needed for gout pain   Glucosamine 500 MG Caps Take 1,000 mg by mouth daily.   HYDROcodone-acetaminophen 5-325 MG tablet Commonly known as:  NORCO/VICODIN Take 1 tablet by mouth every 6 (six) hours as needed for moderate pain.   lisinopril 10 MG tablet Commonly known as:  PRINIVIL,ZESTRIL Take 1 tablet by mouth once daily for blood pressure.   metFORMIN 1000 MG tablet Commonly known as:  GLUCOPHAGE TAKE 1 TABLET (1,000 MG TOTAL) BY MOUTH 2 (TWO) TIMES DAILY WITH MEALS.   multivitamin capsule Take 1 capsule by mouth daily.   simvastatin 40 MG tablet Commonly known as:  ZOCOR Take 1 tablet by mouth every evening for cholesterol.   tamsulosin 0.4 MG Caps capsule Commonly known as:  FLOMAX Take 1 capsule (0.4 mg  total) by mouth daily.       Allergies:  Allergies  Allergen Reactions  . Oxycodone-Acetaminophen Nausea Only    Family History: Family History  Problem Relation Age of Onset  . Heart disease Father   . Heart attack Father 60       deceased  . Colon cancer Mother 1  . Breast cancer Mother     Social History:  reports that he has never smoked. His smokeless tobacco use includes snuff. He reports that he drinks about 1.0 standard drinks of alcohol per week. He reports that he does not use drugs.  ROS: UROLOGY Frequent Urination?: No Hard to postpone urination?:  No Burning/pain with urination?: No Get up at night to urinate?: No Leakage of urine?: No Urine stream starts and stops?: No Trouble starting stream?: No Do you have to strain to urinate?: No Blood in urine?: No Urinary tract infection?: No Sexually transmitted disease?: No Injury to kidneys or bladder?: No Painful intercourse?: No Weak stream?: No Erection problems?: No Penile pain?: No  Gastrointestinal Nausea?: No Vomiting?: No Indigestion/heartburn?: No Diarrhea?: No Constipation?: No  Constitutional Fever: No Night sweats?: No Weight loss?: No Fatigue?: No  Skin Skin rash/lesions?: No Itching?: No  Eyes Blurred vision?: No Double vision?: No  Ears/Nose/Throat Sore throat?: No Sinus problems?: Yes  Hematologic/Lymphatic Swollen glands?: No Easy bruising?: No  Cardiovascular Leg swelling?: No Chest pain?: No  Respiratory Cough?: No Shortness of breath?: No  Endocrine Excessive thirst?: No  Musculoskeletal Back pain?: No Joint pain?: Yes  Neurological Headaches?: No Dizziness?: No  Psychologic Depression?: No Anxiety?: No  Physical Exam: BP (!) 143/99 (BP Location: Left Arm, Patient Position: Sitting, Cuff Size: Large)   Pulse 97   Ht 6\' 6"  (1.981 m)   Wt (!) 323 lb 9.6 oz (146.8 kg)   BMI 37.40 kg/m   Constitutional: Well nourished. Alert and oriented, No acute distress. HEENT: [place] AT, moist mucus membranes. Trachea midline, no masses. Cardiovascular: No clubbing, cyanosis, or edema. Respiratory: Normal respiratory effort, no increased work of breathing. Skin: No rashes, bruises or suspicious lesions. Lymph: No cervical or inguinal adenopathy. Neurologic: Grossly intact, no focal deficits, moving all 4 extremities. Psychiatric: Normal mood and affect.  Laboratory Data: Lab Results  Component Value Date   WBC 7.1 05/25/2018   HGB 14.9 05/25/2018   HCT 42.9 05/25/2018   MCV 88.1 05/25/2018   PLT 242 05/25/2018    Lab  Results  Component Value Date   CREATININE 1.12 05/25/2018    No results found for: PSA  No results found for: TESTOSTERONE  Lab Results  Component Value Date   HGBA1C 7.8 (H) 03/17/2018    No results found for: TSH     Component Value Date/Time   CHOL 168 03/17/2018 0809   HDL 37.20 (L) 03/17/2018 0809   CHOLHDL 5 03/17/2018 0809   VLDL 42.8 (H) 03/17/2018 0809   LDLCALC 81 03/24/2017 0926    Lab Results  Component Value Date   AST 48 (H) 05/25/2018   Lab Results  Component Value Date   ALT 68 (H) 05/25/2018   No components found for: ALKALINEPHOPHATASE No components found for: BILIRUBINTOTAL  No results found for: ESTRADIOL  I have reviewed the labs.   Pertinent Imaging: CLINICAL DATA:  Known left kidney stone.  EXAM: ABDOMEN - 1 VIEW  COMPARISON:  July 06, 2018  FINDINGS: The bowel gas pattern is normal. Previously noted 5 mm round calcific density in the left pelvis is  unchanged.  IMPRESSION: Previously noted 5 mm round calcific density in the left pelvis is unchanged.   Electronically Signed   By: Sherian Rein M.D.   On: 08/06/2018 14:32 I have independently reviewed the films it appears calcification is in the blader.   Assessment & Plan:    1. Left ureteral stone Stone likely in the bladder today's KUB  Will get a RUS at this time   2. Left hydronephrosis Obtain RUS to ensure the hydronephrosis has resolved once they have passed and/or recovered from procedure to ensure to iatrogenic hydronephrosis remains - it is explained to the patient that it is important to document resolution of the hydronephrosis as "silent hydronephrosis" can occur and cause damage and/or loss of the kidney  3. Microscopic hematuria UA on 06/11/2018 was negative for hematuria   Return for I will call patient with results.  These notes generated with voice recognition software. I apologize for typographical errors.  Michiel Cowboy,  PA-C  [facility] [address]  [address] [place], [place] [address] [phone]

## 2018-08-06 NOTE — Progress Notes (Signed)
KUB order is in .  

## 2018-08-10 ENCOUNTER — Institutional Professional Consult (permissible substitution): Payer: Medicare HMO | Admitting: Internal Medicine

## 2018-08-20 ENCOUNTER — Ambulatory Visit
Admission: RE | Admit: 2018-08-20 | Discharge: 2018-08-20 | Disposition: A | Discharge status: Home / Self Care | Payer: Medicare HMO | Source: Ambulatory Visit | Attending: Urology | Admitting: Urology

## 2018-08-20 DIAGNOSIS — N132 Hydronephrosis with renal and ureteral calculous obstruction: Secondary | ICD-10-CM | POA: Insufficient documentation

## 2018-08-20 DIAGNOSIS — N201 Calculus of ureter: Secondary | ICD-10-CM

## 2018-08-23 ENCOUNTER — Telehealth: Payer: Self-pay

## 2018-08-23 NOTE — Telephone Encounter (Signed)
-----   Message from Harle Battiest, PA-C sent at 08/23/2018  7:52 AM EDT ----- Please let Nathan Krause know that his renal ultrasound was normal.  I would advise that he undergo a Litholink (24 hour metabolic work up) as he has a history of stones.  This would help to determine why he forms stones.

## 2018-08-23 NOTE — Telephone Encounter (Signed)
Called pt no answer. LM for pt informing him of the information below. Advised pt to call back if he is agreeable to completing 24hr urine.

## 2018-09-03 ENCOUNTER — Institutional Professional Consult (permissible substitution): Payer: Medicare HMO | Admitting: Internal Medicine

## 2018-09-27 ENCOUNTER — Encounter: Payer: Self-pay | Admitting: Primary Care

## 2018-09-27 ENCOUNTER — Ambulatory Visit (INDEPENDENT_AMBULATORY_CARE_PROVIDER_SITE_OTHER): Payer: Medicare HMO | Admitting: Primary Care

## 2018-09-27 ENCOUNTER — Ambulatory Visit: Payer: Medicare HMO | Admitting: Primary Care

## 2018-09-27 VITALS — BP 118/78 | HR 73 | Temp 98.0°F | Ht 78.0 in | Wt 325.0 lb

## 2018-09-27 DIAGNOSIS — E119 Type 2 diabetes mellitus without complications: Secondary | ICD-10-CM

## 2018-09-27 DIAGNOSIS — R0683 Snoring: Secondary | ICD-10-CM

## 2018-09-27 NOTE — Assessment & Plan Note (Signed)
A1C above goal, would like to see him below 7.  He is working to improve his diet and is exercising regularly. Continue Metformin 1000 mg BID, continue to work on lifestyle changes.  Managed on statin and ACE. Eye exam scheduled. Foot exam UTD. Pneumonia vaccination UTD.  Will repeat A1C in late February 2020. Follow up in  6 months.

## 2018-09-27 NOTE — Patient Instructions (Signed)
Start checking your blood sugars 2-3 times weekly as discussed, rotate times of checks.  You can check: Before breakfast, lunch, or dinner 2 hours after breakfast, lunch, or dinner. Bedtime   Schedule a lab only appointment for late February 2020 for A1C check.  Please schedule a physical with me and a Wellness Visit with our nurse in 6 months.   It was a pleasure to see you today!   Diabetes Mellitus and Nutrition When you have diabetes (diabetes mellitus), it is very important to have healthy eating habits because your blood sugar (glucose) levels are greatly affected by what you eat and drink. Eating healthy foods in the appropriate amounts, at about the same times every day, can help you:  Control your blood glucose.  Lower your risk of heart disease.  Improve your blood pressure.  Reach or maintain a healthy weight.  Every person with diabetes is different, and each person has different needs for a meal plan. Your health care provider may recommend that you work with a diet and nutrition specialist (dietitian) to make a meal plan that is best for you. Your meal plan may vary depending on factors such as:  The calories you need.  The medicines you take.  Your weight.  Your blood glucose, blood pressure, and cholesterol levels.  Your activity level.  Other health conditions you have, such as heart or kidney disease.  How do carbohydrates affect me? Carbohydrates affect your blood glucose level more than any other type of food. Eating carbohydrates naturally increases the amount of glucose in your blood. Carbohydrate counting is a method for keeping track of how many carbohydrates you eat. Counting carbohydrates is important to keep your blood glucose at a healthy level, especially if you use insulin or take certain oral diabetes medicines. It is important to know how many carbohydrates you can safely have in each meal. This is different for every person. Your dietitian can  help you calculate how many carbohydrates you should have at each meal and for snack. Foods that contain carbohydrates include:  Bread, cereal, rice, pasta, and crackers.  Potatoes and corn.  Peas, beans, and lentils.  Milk and yogurt.  Fruit and juice.  Desserts, such as cakes, cookies, ice cream, and candy.  How does alcohol affect me? Alcohol can cause a sudden decrease in blood glucose (hypoglycemia), especially if you use insulin or take certain oral diabetes medicines. Hypoglycemia can be a life-threatening condition. Symptoms of hypoglycemia (sleepiness, dizziness, and confusion) are similar to symptoms of having too much alcohol. If your health care provider says that alcohol is safe for you, follow these guidelines:  Limit alcohol intake to no more than 1 drink per day for nonpregnant women and 2 drinks per day for men. One drink equals 12 oz of beer, 5 oz of wine, or 1 oz of hard liquor.  Do not drink on an empty stomach.  Keep yourself hydrated with water, diet soda, or unsweetened iced tea.  Keep in mind that regular soda, juice, and other mixers may contain a lot of sugar and must be counted as carbohydrates.  What are tips for following this plan? Reading food labels  Start by checking the serving size on the label. The amount of calories, carbohydrates, fats, and other nutrients listed on the label are based on one serving of the food. Many foods contain more than one serving per package.  Check the total grams (g) of carbohydrates in one serving. You can calculate the number  of servings of carbohydrates in one serving by dividing the total carbohydrates by 15. For example, if a food has 30 g of total carbohydrates, it would be equal to 2 servings of carbohydrates.  Check the number of grams (g) of saturated and trans fats in one serving. Choose foods that have low or no amount of these fats.  Check the number of milligrams (mg) of sodium in one serving. Most  people should limit total sodium intake to less than 2,300 mg per day.  Always check the nutrition information of foods labeled as "low-fat" or "nonfat". These foods may be higher in added sugar or refined carbohydrates and should be avoided.  Talk to your dietitian to identify your daily goals for nutrients listed on the label. Shopping  Avoid buying canned, premade, or processed foods. These foods tend to be high in fat, sodium, and added sugar.  Shop around the outside edge of the grocery store. This includes fresh fruits and vegetables, bulk grains, fresh meats, and fresh dairy. Cooking  Use low-heat cooking methods, such as baking, instead of high-heat cooking methods like deep frying.  Cook using healthy oils, such as olive, canola, or sunflower oil.  Avoid cooking with butter, cream, or high-fat meats. Meal planning  Eat meals and snacks regularly, preferably at the same times every day. Avoid going long periods of time without eating.  Eat foods high in fiber, such as fresh fruits, vegetables, beans, and whole grains. Talk to your dietitian about how many servings of carbohydrates you can eat at each meal.  Eat 4-6 ounces of lean protein each day, such as lean meat, chicken, fish, eggs, or tofu. 1 ounce is equal to 1 ounce of meat, chicken, or fish, 1 egg, or 1/4 cup of tofu.  Eat some foods each day that contain healthy fats, such as avocado, nuts, seeds, and fish. Lifestyle   Check your blood glucose regularly.  Exercise at least 30 minutes 5 or more days each week, or as told by your health care provider.  Take medicines as told by your health care provider.  Do not use any products that contain nicotine or tobacco, such as cigarettes and e-cigarettes. If you need help quitting, ask your health care provider.  Work with a Veterinary surgeon or diabetes educator to identify strategies to manage stress and any emotional and social challenges. What are some questions to ask my  health care provider?  Do I need to meet with a diabetes educator?  Do I need to meet with a dietitian?  What number can I call if I have questions?  When are the best times to check my blood glucose? Where to find more information:  American Diabetes Association: diabetes.org/food-and-fitness/food  Academy of Nutrition and Dietetics: https://www.vargas.com/  [employer] of Diabetes and Digestive and Kidney Diseases (NIH): FindJewelers.cz Summary  A healthy meal plan will help you control your blood glucose and maintain a healthy lifestyle.  Working with a diet and nutrition specialist (dietitian) can help you make a meal plan that is best for you.  Keep in mind that carbohydrates and alcohol have immediate effects on your blood glucose levels. It is important to count carbohydrates and to use alcohol carefully. This information is not intended to replace advice given to you by your health care provider. Make sure you discuss any questions you have with your health care provider. Document Released: 07/10/2005 Document Revised: 11/17/2016 Document Reviewed: 11/17/2016 Elsevier Interactive Patient Education  [employer].

## 2018-09-27 NOTE — Assessment & Plan Note (Signed)
Will undergo sleep study this week.

## 2018-09-27 NOTE — Progress Notes (Signed)
Subjective:    Patient ID: Nathan BackersBrian D Krause, male    DOB: 1971/10/02, 47 y.o.   MRN: [id number]  HPI  Mr. Nathan Krause is a 47 year old male who presents today for follow up of diabetes.  Current medications include: Metformin 1000 mg BID.   He does not check his glucose readings, does have a glucometer.   Last A1C: 7.6 in mid October 2019, 7.8 in May 2019 Last Eye Exam: Scheduled for January 2020 Last Foot Exam: Due in May 2020 Pneumonia Vaccination: Completed 2016 ACE/ARB: Lisinopril  Statin:Simvastatin  Diet currently consists of:  Breakfast: Oatmeal, frozen breakfast sandwiches  Lunch: Sandwich  DinnerResearch scientist (medical): [employer], tries to make healthy choices Snacks: Nuts, fruit, chips Desserts: Twice weekly  Beverages: Little soda -tries to do diet soda, water, juice, little sweet tea  Exercise: He is exercising at the gym 3-4 days weekly   Wt Readings from Last 3 Encounters:  09/27/18 (!) 325 lb (147.4 kg)  08/06/18 (!) 323 lb 9.6 oz (146.8 kg)  08/06/18 (!) 324 lb (147 kg)     Review of Systems  Eyes: Negative for visual disturbance.  Respiratory: Negative for shortness of breath.   Cardiovascular: Negative for chest pain.  Neurological: Negative for dizziness, numbness and headaches.       Past Medical History:  Diagnosis Date  . Anxiety and depression   . Asthma   . DVT (deep venous thrombosis) (HCC)   . Essential hypertension   . Gout   . Hyperlipidemia   . Pulmonary embolism (HCC)   . Rheumatoid arthritis (HCC)   . Type 2 diabetes mellitus without complication (HCC)      Social History   Socioeconomic History  . Marital status: Married    Spouse name: Not on file  . Number of children: Not on file  . Years of education: Not on file  . Highest education level: Not on file  Occupational History  . Occupation: Disabled  Social Needs  . Financial resource strain: Not on file  . Food insecurity:    Worry: Not on file    Inability: Not on file  . Transportation  needs:    Medical: Not on file    Non-medical: Not on file  Tobacco Use  . Smoking status: Never Smoker  . Smokeless tobacco: Current User    Types: Snuff  Substance and Sexual Activity  . Alcohol use: Yes    Alcohol/week: 1.0 standard drinks    Types: 1 Cans of beer per week    Comment: social  . Drug use: Never  . Sexual activity: Yes    Birth control/protection: None  Lifestyle  . Physical activity:    Days per week: Not on file    Minutes per session: Not on file  . Stress: Not on file  Relationships  . Social connections:    Talks on phone: Not on file    Gets together: Not on file    Attends religious service: Not on file    Active member of club or organization: Not on file    Attends meetings of clubs or organizations: Not on file    Relationship status: Not on file  . Intimate partner violence:    Fear of current or ex partner: Not on file    Emotionally abused: Not on file    Physically abused: Not on file    Forced sexual activity: Not on file  Other Topics Concern  . Not on file  Social History  Narrative   Married.   2 children.   Disabled and does not work.   Enjoys watching movies, working out, spending time with dogs, traveling.    Past Surgical History:  Procedure Laterality Date  . EXTRACORPOREAL SHOCK WAVE LITHOTRIPSY Left 06/03/2018   Procedure: EXTRACORPOREAL SHOCK WAVE LITHOTRIPSY (ESWL);  Surgeon: Vanna Scotland, MD;  Location: [facility] ORS;  Service: Urology;  Laterality: Left;  . KNEE ARTHROSCOPY Right   . KNEE SURGERY Left     Family History  Problem Relation Age of Onset  . Heart disease Father   . Heart attack Father 9       deceased  . Colon cancer Mother 62  . Breast cancer Mother     Allergies  Allergen Reactions  . Oxycodone-Acetaminophen Nausea Only    Current Outpatient Medications on File Prior to Visit  Medication Sig Dispense Refill  . albuterol (PROVENTIL HFA;VENTOLIN HFA) 108 (90 Base) MCG/ACT inhaler Inhale 2 puffs  into the lungs every 4 (four) hours as needed for wheezing or shortness of breath. 1 Inhaler 0  . aspirin 81 MG tablet Take 81 mg by mouth daily.     . citalopram (CELEXA) 20 MG tablet Take 1 tablet (20 mg total) by mouth daily. 90 tablet 3  . Glucosamine 500 MG CAPS Take 1,000 mg by mouth daily.     Marland Kitchen lisinopril (PRINIVIL,ZESTRIL) 10 MG tablet Take 1 tablet by mouth once daily for blood pressure. 90 tablet 3  . metFORMIN (GLUCOPHAGE) 1000 MG tablet TAKE 1 TABLET (1,000 MG TOTAL) BY MOUTH 2 (TWO) TIMES DAILY WITH MEALS. 180 tablet 3  . Multiple Vitamin (MULTIVITAMIN) capsule Take 1 capsule by mouth daily.     . simvastatin (ZOCOR) 40 MG tablet Take 1 tablet by mouth every evening for cholesterol. 90 tablet 3  . tamsulosin (FLOMAX) 0.4 MG CAPS capsule Take 1 capsule (0.4 mg total) by mouth daily. 90 capsule 3  . colchicine 0.6 MG tablet Take one pill by mouth up to three times daily with food as needed for gout pain (Patient not taking: Reported on 08/06/2018) 15 tablet 0   No current facility-administered medications on file prior to visit.     BP 118/78   Pulse 73   Temp 98 F (36.7 C) (Oral)   Ht 6\' 6"  (1.981 m)   Wt (!) 325 lb (147.4 kg)   SpO2 96%   BMI 37.56 kg/m    Objective:   Physical Exam  Constitutional: He appears well-nourished.  Neck: Neck supple.  Cardiovascular: Normal rate and regular rhythm.  Respiratory: Effort normal and breath sounds normal.  Skin: Skin is warm and dry.           Assessment & Plan:

## 2018-10-01 ENCOUNTER — Institutional Professional Consult (permissible substitution): Payer: Medicare HMO | Admitting: Internal Medicine

## 2018-11-04 ENCOUNTER — Telehealth: Payer: Self-pay | Admitting: Urology

## 2018-11-04 NOTE — Telephone Encounter (Signed)
Would you check and see if he is wanting to do the 24 hour urine for stones?

## 2018-11-05 ENCOUNTER — Institutional Professional Consult (permissible substitution): Payer: Medicare HMO | Admitting: Internal Medicine

## 2018-11-08 NOTE — Telephone Encounter (Signed)
Spoke to patient and he states he had the stone study done in the past. He states he knows what foods to stay away from and what type of stone he produces.

## 2018-11-25 NOTE — Telephone Encounter (Signed)
Mr. Dumaine has had his follow up RUS.

## 2018-11-25 NOTE — Telephone Encounter (Signed)
He has had his follow up RUS and we asked if he wanted to pursue the metabolic work up and he stated he wanted no more follow up.

## 2018-11-25 NOTE — Telephone Encounter (Signed)
Does this patient need a follow up appointment or imaging? Nothing has been scheduled as of yet.

## 2018-12-14 ENCOUNTER — Other Ambulatory Visit: Payer: Self-pay | Admitting: Primary Care

## 2018-12-14 DIAGNOSIS — E119 Type 2 diabetes mellitus without complications: Secondary | ICD-10-CM

## 2018-12-20 ENCOUNTER — Other Ambulatory Visit: Payer: Medicare HMO

## 2019-01-05 ENCOUNTER — Other Ambulatory Visit (INDEPENDENT_AMBULATORY_CARE_PROVIDER_SITE_OTHER): Payer: Medicare HMO

## 2019-01-05 ENCOUNTER — Other Ambulatory Visit: Payer: Self-pay

## 2019-01-05 DIAGNOSIS — E119 Type 2 diabetes mellitus without complications: Secondary | ICD-10-CM | POA: Diagnosis not present

## 2019-01-05 LAB — POCT GLYCOSYLATED HEMOGLOBIN (HGB A1C): HEMOGLOBIN A1C: 7.3 % — AB (ref 4.0–5.6)

## 2019-02-17 NOTE — Telephone Encounter (Signed)
error 

## 2019-02-25 ENCOUNTER — Telehealth: Payer: Self-pay | Admitting: Primary Care

## 2019-02-25 NOTE — Telephone Encounter (Signed)
Patient due for diabetes follow up after June 11th, please schedule.

## 2019-03-08 NOTE — Telephone Encounter (Signed)
lvm asking patient to call office °

## 2019-04-08 ENCOUNTER — Other Ambulatory Visit: Payer: Self-pay

## 2019-04-08 ENCOUNTER — Ambulatory Visit (INDEPENDENT_AMBULATORY_CARE_PROVIDER_SITE_OTHER): Payer: Medicare HMO | Admitting: Primary Care

## 2019-04-08 VITALS — BP 122/82 | HR 76 | Temp 97.8°F | Ht 78.0 in | Wt 324.8 lb

## 2019-04-08 DIAGNOSIS — J4520 Mild intermittent asthma, uncomplicated: Secondary | ICD-10-CM | POA: Diagnosis not present

## 2019-04-08 DIAGNOSIS — R0683 Snoring: Secondary | ICD-10-CM

## 2019-04-08 DIAGNOSIS — E785 Hyperlipidemia, unspecified: Secondary | ICD-10-CM

## 2019-04-08 DIAGNOSIS — N529 Male erectile dysfunction, unspecified: Secondary | ICD-10-CM | POA: Insufficient documentation

## 2019-04-08 DIAGNOSIS — I10 Essential (primary) hypertension: Secondary | ICD-10-CM | POA: Diagnosis not present

## 2019-04-08 DIAGNOSIS — F329 Major depressive disorder, single episode, unspecified: Secondary | ICD-10-CM

## 2019-04-08 DIAGNOSIS — E119 Type 2 diabetes mellitus without complications: Secondary | ICD-10-CM

## 2019-04-08 DIAGNOSIS — F419 Anxiety disorder, unspecified: Secondary | ICD-10-CM

## 2019-04-08 LAB — COMPREHENSIVE METABOLIC PANEL
ALT: 35 U/L (ref 0–53)
AST: 23 U/L (ref 0–37)
Albumin: 4.3 g/dL (ref 3.5–5.2)
Alkaline Phosphatase: 47 U/L (ref 39–117)
BUN: 9 mg/dL (ref 6–23)
CO2: 29 mEq/L (ref 19–32)
Calcium: 9.4 mg/dL (ref 8.4–10.5)
Chloride: 101 mEq/L (ref 96–112)
Creatinine, Ser: 1.11 mg/dL (ref 0.40–1.50)
GFR: 70.59 mL/min (ref >60.00)
Glucose, Bld: 148 mg/dL — ABNORMAL HIGH (ref 70–99)
Potassium: 4.1 mEq/L (ref 3.5–5.1)
Sodium: 140 mEq/L (ref 135–145)
Total Bilirubin: 0.5 mg/dL (ref 0.2–1.2)
Total Protein: 7.2 g/dL (ref 6.0–8.3)

## 2019-04-08 LAB — POCT GLYCOSYLATED HEMOGLOBIN (HGB A1C): Hemoglobin A1C: 7.9 % — AB (ref 4.0–5.6)

## 2019-04-08 LAB — LIPID PANEL
Cholesterol: 138 mg/dL (ref 0–200)
HDL: 38.3 mg/dL — ABNORMAL LOW (ref >39.00)
LDL Cholesterol: 67 mg/dL (ref 0–99)
NonHDL: 99.92
Total CHOL/HDL Ratio: 4
Triglycerides: 164 mg/dL — ABNORMAL HIGH (ref 0.0–149.0)
VLDL: 32.8 mg/dL (ref 0.0–40.0)

## 2019-04-08 MED ORDER — GLIPIZIDE ER 5 MG PO TB24: 5.0000 mg | ORAL_TABLET | Freq: Every day | ORAL | 1 refills | Status: DC

## 2019-04-08 MED ORDER — SILDENAFIL CITRATE 20 MG PO TABS: ORAL_TABLET | ORAL | 0 refills | Status: DC

## 2019-04-08 NOTE — Assessment & Plan Note (Signed)
A1C today of 7.9 which is an increase from prior. Given chronic elevated readings we will add on Glipizide ER 5 mg daily to his regimen. Continue Metformin.  Foot exam today. Managed on ACE and statin. He will schedule an eye exam. Pneumonia vaccination UTD.  Repeat A1C in 3 months, follow up in 6 months.

## 2019-04-08 NOTE — Patient Instructions (Signed)
Start glipizide ER 5 mg tablets for diabetes. Take 1 tablet by mouth every morning. Continue Metformin.  You may take sildenafil 20 mg tablets for erectile dysfunction. Take 2-5 tablets 30 minutes prior to intercourse.  You will be contacted regarding your referral to pulmonology for the sleep study.  Please let us know if you have not been contacted within one week.   Schedule a lab only appointment for 3 months for diabetes check.  Please schedule a follow up appointment in 6 months.   It was a pleasure to see you today!

## 2019-04-08 NOTE — Assessment & Plan Note (Signed)
Repeat lipids pending. Compliant to simvastatin.

## 2019-04-08 NOTE — Assessment & Plan Note (Signed)
Overall stable, infrequent flares.  Continue albuterol inhaler PRN.

## 2019-04-08 NOTE — Assessment & Plan Note (Signed)
Stable in the office today, continue lisinopril. BMP pending.  

## 2019-04-08 NOTE — Assessment & Plan Note (Signed)
Never completed sleep study last year. New referral placed for pulmonology.

## 2019-04-08 NOTE — Assessment & Plan Note (Addendum)
Managed on cialis in the past, this was too expensive. Rx for sildenafil 20 mg sent to pharmacy, discussed directions for use. He will update.

## 2019-04-08 NOTE — Assessment & Plan Note (Signed)
Doing well on citalopram 20 mg daily.  Denies SI/HI.  Continue same.

## 2019-04-08 NOTE — Progress Notes (Signed)
Subjective:    Patient ID: Nathan BackersBrian D Otterness, male    DOB: December 12, 1970, 48 y.o.   MRN: [id number]  HPI  Mr. Nathan Krause is a 48 year old male who presents today for follow up of chronic conditions.  1) Type 2 Diabetes:   Current medications include: Metformin 1000 mg BID  He does not check his glucose levels.   Last A1C: 7.3 in March 2020 Last Eye Exam: No recent exam Last Foot Exam:  Due today Pneumonia Vaccination: Completed in 2016 ACE/ARB: Lisinopril  Statin: Simvastatin   2)  Depression/Anxiety: Currently managed on citalopram 20 mg daily. Overall doing well, denies SI/HI.  3) Essential Hypertension: Currently managed on lisinopril 10 mg daily. He does not check his BP at home. He denies chest pain, shortness of breath.  BP Readings from Last 3 Encounters:  04/08/19 122/82  09/27/18 118/78  08/06/18 (!) 143/99   4) Asthma: Overall doing well, using albuterol inhaler infrequently. Denies wheezing and shortness of breath.  5) Erectile Dysfunction: Prior history and was once managed on daily Cialis 5 mg. This became to costly and his symptoms actually improved so he hasn't taken anything for several years. Now he's having trough maintaining an erection each time he has intercourse. He would like to try something more affordable.   Review of Systems  Respiratory: Negative for shortness of breath and wheezing.   Genitourinary:       Erectile dysfunction  Neurological: Negative for dizziness, numbness and headaches.       Past Medical History:  Diagnosis Date  . Anxiety and depression   . Asthma   . DVT (deep venous thrombosis) (HCC)   . Essential hypertension   . Gout   . Hyperlipidemia   . Pulmonary embolism (HCC)   . Rheumatoid arthritis (HCC)   . Type 2 diabetes mellitus without complication (HCC)      Social History   Socioeconomic History  . Marital status: Married    Spouse name: Not on file  . Number of children: Not on file  . Years of education: Not on  file  . Highest education level: Not on file  Occupational History  . Occupation: Disabled  Social Needs  . Financial resource strain: Not on file  . Food insecurity    Worry: Not on file    Inability: Not on file  . Transportation needs    Medical: Not on file    Non-medical: Not on file  Tobacco Use  . Smoking status: Never Smoker  . Smokeless tobacco: Current User    Types: Snuff  Substance and Sexual Activity  . Alcohol use: Yes    Alcohol/week: 1.0 standard drinks    Types: 1 Cans of beer per week    Comment: social  . Drug use: Never  . Sexual activity: Yes    Birth control/protection: None  Lifestyle  . Physical activity    Days per week: Not on file    Minutes per session: Not on file  . Stress: Not on file  Relationships  . Social Musicianconnections    Talks on phone: Not on file    Gets together: Not on file    Attends religious service: Not on file    Active member of club or organization: Not on file    Attends meetings of clubs or organizations: Not on file    Relationship status: Not on file  . Intimate partner violence    Fear of current or ex partner: Not  on file    Emotionally abused: Not on file    Physically abused: Not on file    Forced sexual activity: Not on file  Other Topics Concern  . Not on file  Social History Narrative   Married.   2 children.   Disabled and does not work.   Enjoys watching movies, working out, spending time with dogs, traveling.    Past Surgical History:  Procedure Laterality Date  . EXTRACORPOREAL SHOCK WAVE LITHOTRIPSY Left 06/03/2018   Procedure: EXTRACORPOREAL SHOCK WAVE LITHOTRIPSY (ESWL);  Surgeon: Hollice Espy, MD;  Location: [facility] ORS;  Service: Urology;  Laterality: Left;  . KNEE ARTHROSCOPY Right   . KNEE SURGERY Left     Family History  Problem Relation Age of Onset  . Heart disease Father   . Heart attack Father 58       deceased  . Colon cancer Mother 49  . Breast cancer Mother     Allergies   Allergen Reactions  . Oxycodone-Acetaminophen Nausea Only    Current Outpatient Medications on File Prior to Visit  Medication Sig Dispense Refill  . albuterol (PROVENTIL HFA;VENTOLIN HFA) 108 (90 Base) MCG/ACT inhaler Inhale 2 puffs into the lungs every 4 (four) hours as needed for wheezing or shortness of breath. 1 Inhaler 0  . aspirin 81 MG tablet Take 81 mg by mouth daily.     . citalopram (CELEXA) 20 MG tablet Take 1 tablet (20 mg total) by mouth daily. 90 tablet 3  . Glucosamine 500 MG CAPS Take 1,000 mg by mouth daily.     Marland Kitchen lisinopril (PRINIVIL,ZESTRIL) 10 MG tablet Take 1 tablet by mouth once daily for blood pressure. 90 tablet 3  . metFORMIN (GLUCOPHAGE) 1000 MG tablet TAKE 1 TABLET (1,000 MG TOTAL) BY MOUTH 2 (TWO) TIMES DAILY WITH MEALS. 180 tablet 3  . Multiple Vitamin (MULTIVITAMIN) capsule Take 1 capsule by mouth daily.     . simvastatin (ZOCOR) 40 MG tablet Take 1 tablet by mouth every evening for cholesterol. 90 tablet 3   No current facility-administered medications on file prior to visit.     BP 122/82   Pulse 76   Temp 97.8 F (36.6 C) (Tympanic)   Ht 6\' 6"  (1.981 m)   Wt (!) 324 lb 12 oz (147.3 kg)   SpO2 98%   BMI 37.53 kg/m    Objective:   Physical Exam  Constitutional: He appears well-nourished.  Neck: Neck supple.  Cardiovascular: Normal rate and regular rhythm.  Respiratory: Effort normal and breath sounds normal.  Skin: Skin is warm and dry.  Psychiatric: He has a normal mood and affect.           Assessment & Plan:

## 2019-04-17 IMAGING — CR DG ABDOMEN 1V
2 series · 2 of 2 positions shown · non-contrast
Comparison: 01/20/2017 CT abdomen and pelvis.

CLINICAL DATA: 47 y/o M; left lower abdominal pain with nausea and
vomiting.

EXAM:
ABDOMEN - 1 VIEW

[abdomen kub (1 of 2)]
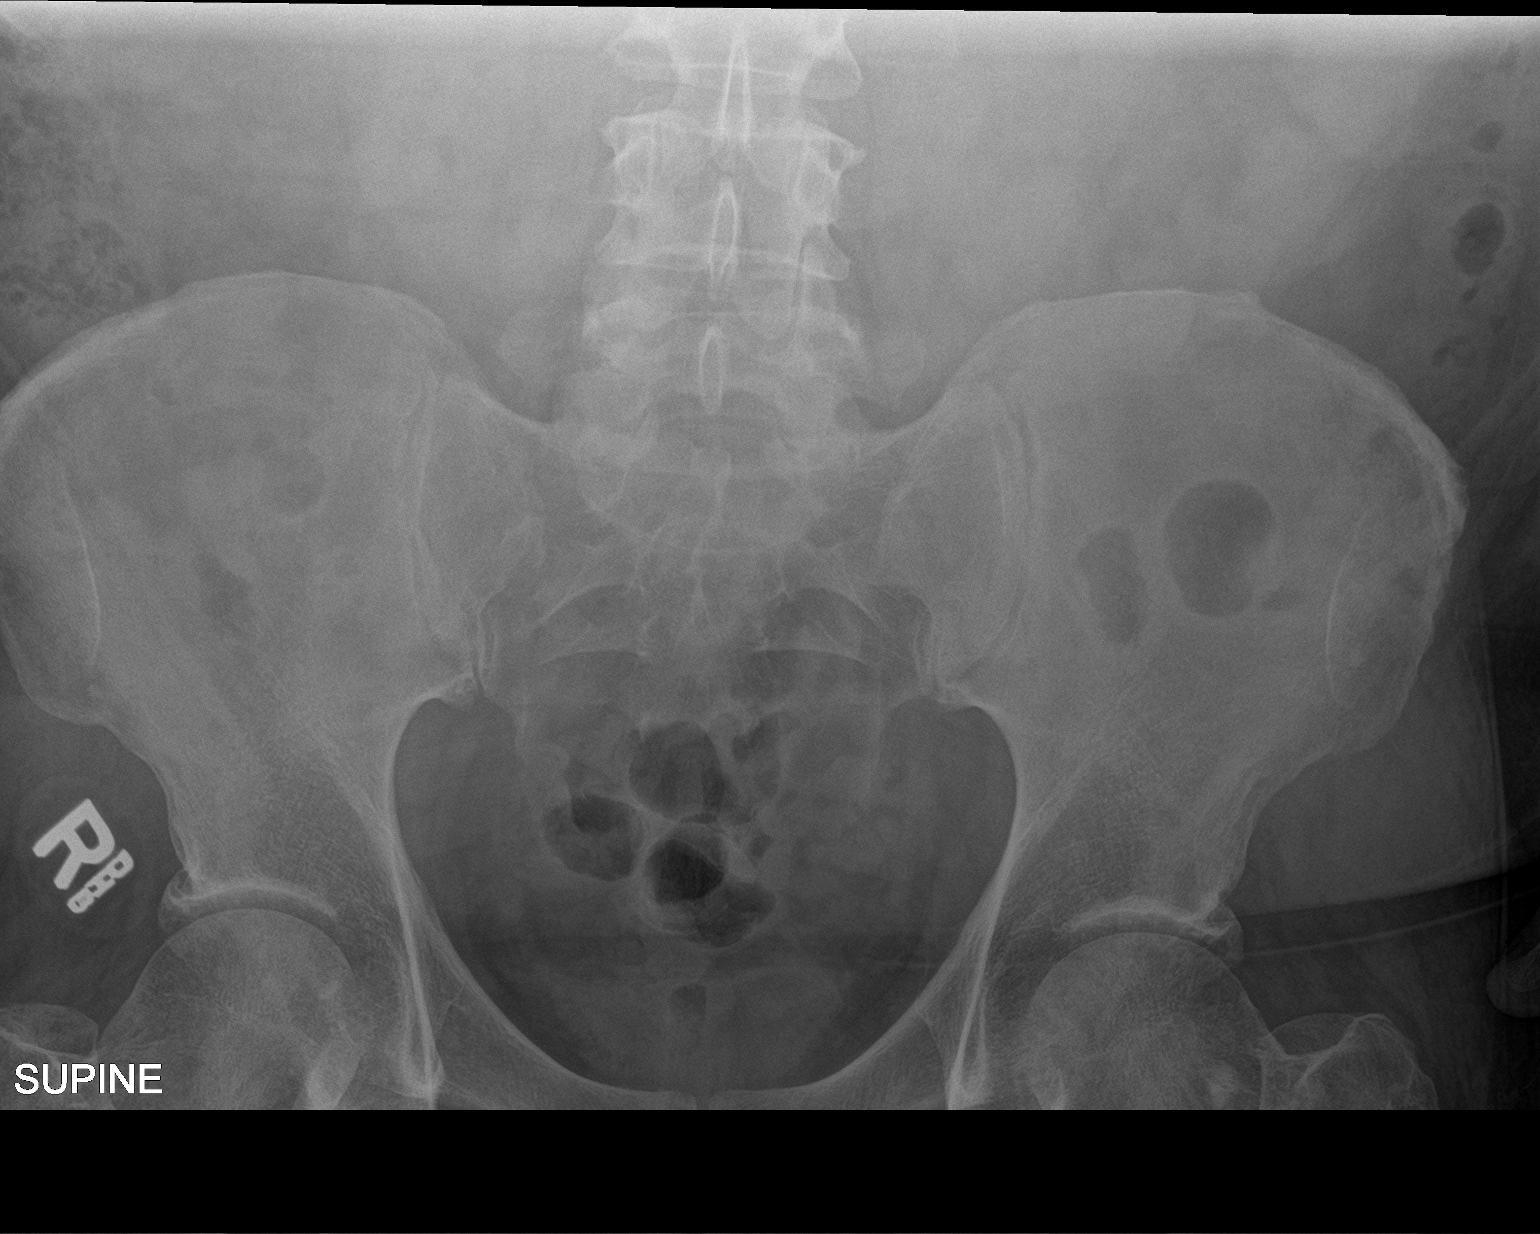

[abdomen kub (2 of 2)]
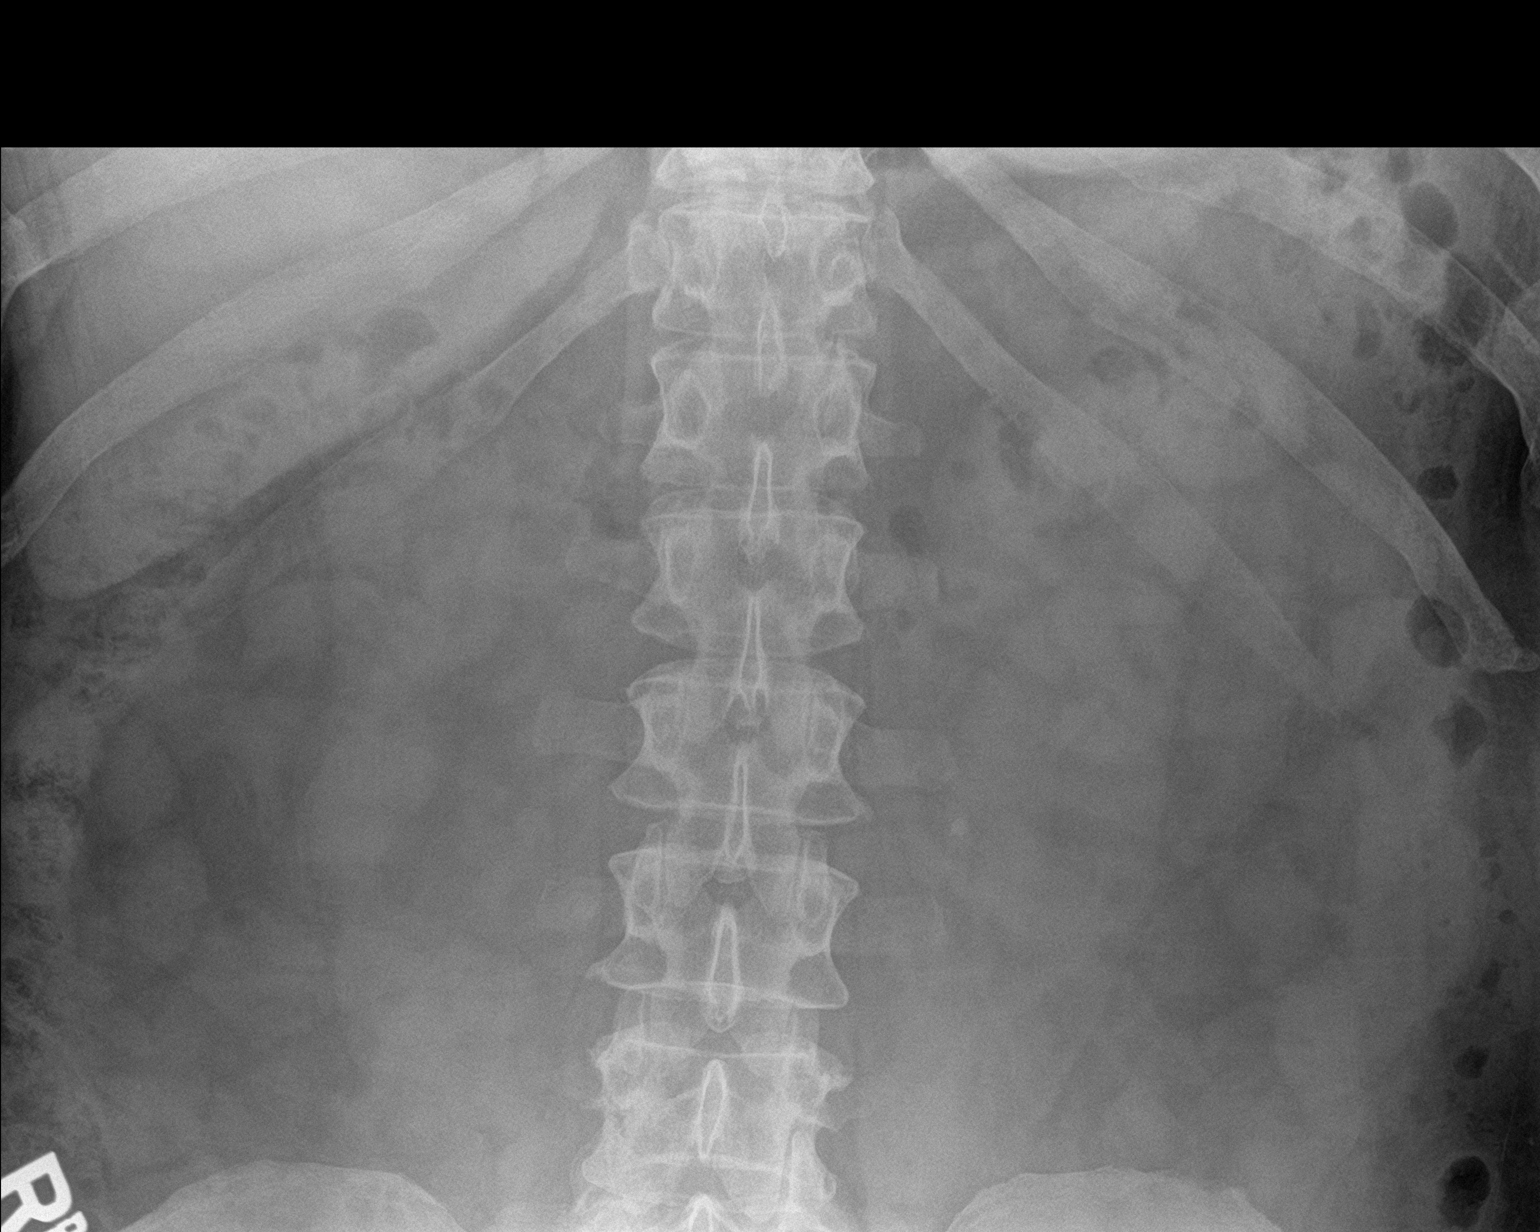

[2 of 2 positions shown; findings below may reference images not displayed]

FINDINGS: The bowel gas pattern is normal. 5 mm density to left of spine at
the L3-4 level may represent a proximal ureter stone.
IMPRESSION: 5 mm density to left of spine at the L3-4 level may represent a
proximal ureter stone.

By: Libaniel Kampo M.D.

## 2019-04-20 ENCOUNTER — Ambulatory Visit (INDEPENDENT_AMBULATORY_CARE_PROVIDER_SITE_OTHER): Payer: Medicare HMO | Admitting: Internal Medicine

## 2019-04-20 DIAGNOSIS — G4719 Other hypersomnia: Secondary | ICD-10-CM

## 2019-04-20 NOTE — Patient Instructions (Signed)

## 2019-04-20 NOTE — Progress Notes (Addendum)
[facility] Consultation    Virtual Visit via Video Note I connected with patient on 04/20/19 at 10:15 AM EDT by video and verified that I am speaking with the correct person using two identifiers.   I discussed the limitations, risks of performing an evaluation and management service by video and the availability of in person appointments. I also discussed with the patient that there may be a patient responsible charge related to this service.  In light of current covid-19 pandemic, patient also understands that we are trying to protect them by minimizing in office contact if at all possible.  The patient expressed understanding and agreed to proceed. Please see note below for further detail.    The patient was advised to call back or seek an in-person evaluation if the symptoms worsen or if the condition fails to improve as anticipated.   Laverle Hobby, MD   Assessment and Plan:  Excessive daytime sleepiness. - Symptoms and signs of obstructive sleep apnea. - We will send for sleep study, start on CPAP as indicated.  Diabetes mellitus. - Obstructive sleep apnea can contribute to above condition, therefore treatment of sleep apnea is an important part of management.  Orders Placed This Encounter  Procedures  . Home sleep test   Return in about 3 months (around 07/21/2019).   Date: 04/20/2019  MRN# [id number] JOSHIAH TRAYNHAM 08-24-1971  Referring Physician: Referred by Gentry Fitz.   IRMA DELANCEY is a 48 y.o. old male seen in consultation for chief complaint of: snoring.     HPI:   He is snoring loudly, and wife notes that it is worse recently. Some witnessed apneas. He is sleepy during the day. He goes to bed 10, watches tv for 1.5 hours, fall asleep around 12;30 to 1 am after turning tv off. Wakes at 8:30 to 9 am. 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  Denies sleep paralysis, no cataplexy, denies jaw pain, no TMJ but does have jaw clicking and popping, no  dentures.  Does have am headaches.    PMHX:   Past Medical History:  Diagnosis Date  . Anxiety and depression   . Asthma   . DVT (deep venous thrombosis) ([place])   . Essential hypertension   . Gout   . Hyperlipidemia   . Pulmonary embolism ([place])   . Rheumatoid arthritis ([place])   . Type 2 diabetes mellitus without complication [facility])    Surgical Hx:  Past Surgical History:  Procedure Laterality Date  . EXTRACORPOREAL SHOCK WAVE LITHOTRIPSY Left 06/03/2018   Procedure: EXTRACORPOREAL SHOCK WAVE LITHOTRIPSY (ESWL);  Surgeon: Hollice Espy, MD;  Location: [facility] ORS;  Service: Urology;  Laterality: Left;  . KNEE ARTHROSCOPY Right   . KNEE SURGERY Left    Family Hx:  Family History  Problem Relation Age of Onset  . Heart disease Father   . Heart attack Father 38       deceased  . Colon cancer Mother 83  . Breast cancer Mother    Social Hx:   Social History   Tobacco Use  . Smoking status: Never Smoker  . Smokeless tobacco: Current User    Types: Snuff  Substance Use Topics  . Alcohol use: Yes    Alcohol/week: 1.0 standard drinks    Types: 1 Cans of beer per week    Comment: social  . Drug use: Never   Medication:    Current Outpatient Medications:  .  albuterol (PROVENTIL HFA;VENTOLIN HFA) 108 (90  Base) MCG/ACT inhaler, Inhale 2 puffs into the lungs every 4 (four) hours as needed for wheezing or shortness of breath., Disp: 1 Inhaler, Rfl: 0 .  aspirin 81 MG tablet, Take 81 mg by mouth daily. , Disp: , Rfl:  .  citalopram (CELEXA) 20 MG tablet, Take 1 tablet (20 mg total) by mouth daily., Disp: 90 tablet, Rfl: 3 .  glipiZIDE (GLUCOTROL XL) 5 MG 24 hr tablet, Take 1 tablet (5 mg total) by mouth daily with breakfast. For diabetes., Disp: 90 tablet, Rfl: 1 .  Glucosamine 500 MG CAPS, Take 1,000 mg by mouth daily. , Disp: , Rfl:  .  lisinopril (PRINIVIL,ZESTRIL) 10 MG tablet, Take 1 tablet by mouth once daily for blood pressure., Disp: 90 tablet, Rfl: 3 .  metFORMIN  (GLUCOPHAGE) 1000 MG tablet, TAKE 1 TABLET (1,000 MG TOTAL) BY MOUTH 2 (TWO) TIMES DAILY WITH MEALS., Disp: 180 tablet, Rfl: 3 .  Multiple Vitamin (MULTIVITAMIN) capsule, Take 1 capsule by mouth daily. , Disp: , Rfl:  .  sildenafil (REVATIO) 20 MG tablet, Take 2-5 tablets by mouth 30 minutes prior to intercourse., Disp: 50 tablet, Rfl: 0 .  simvastatin (ZOCOR) 40 MG tablet, Take 1 tablet by mouth every evening for cholesterol., Disp: 90 tablet, Rfl: 3   Allergies:  Oxycodone-acetaminophen  Review of Systems: Gen:  Denies  fever, sweats, chills HEENT: Denies blurred vision, double vision. bleeds, sore throat Cvc:  No dizziness, chest pain. Resp:   Denies cough or sputum production, shortness of breath Gi: Denies swallowing difficulty, stomach pain. Gu:  Denies bladder incontinence, burning urine Ext:   No Joint pain, stiffness. Skin: No skin rash,  hives  Endoc:  No polyuria, polydipsia. Psych: No depression, insomnia. Other:  All other systems were reviewed with the patient and were negative other that what is mentioned in the HPI.   Physical Examination:   VS: There were no vitals taken for this visit.  General Appearance: No distress  Neuro:without focal findings,  speech normal,  HEENT: PERRLA, EOM intact.   Pulmonary: normal breath sounds, No wheezing.  CardiovascularNormal S1,S2.  No m/r/g.   Abdomen: Benign, Soft, non-tender. Renal:  No costovertebral tenderness  GU:  No performed at this time. Endoc: No evident thyromegaly, no signs of acromegaly. Skin:   warm, no rashes, no ecchymosis  Extremities: normal, no cyanosis, clubbing.  Other findings:    LABORATORY PANEL:   CBC No results for input(s): WBC, HGB, HCT, PLT in the last 168 hours. ------------------------------------------------------------------------------------------------------------------  Chemistries  No results for input(s): NA, K, CL, CO2, GLUCOSE, BUN, CREATININE, CALCIUM, MG, AST, ALT, ALKPHOS,  BILITOT in the last 168 hours.  Invalid input(s): GFRCGP ------------------------------------------------------------------------------------------------------------------  Cardiac Enzymes No results for input(s): TROPONINI in the last 168 hours. ------------------------------------------------------------  RADIOLOGY:  No results found.     Thank  you for the consultation and for allowing [facility] to assist in the care of your patient. Our recommendations are noted above.  Please contact us if we can be of further service.   Wells Guileseep Veneda Kirksey, M.D., F.C.C.P.  Board Certified in Internal Medicine, Pulmonary Medicine, Critical Care Medicine, and Sleep Medicine.  [facility] Office Number: [phone]   04/20/2019

## 2019-05-09 ENCOUNTER — Other Ambulatory Visit: Payer: Self-pay | Admitting: Primary Care

## 2019-05-09 ENCOUNTER — Telehealth: Payer: Self-pay | Admitting: Internal Medicine

## 2019-05-09 DIAGNOSIS — F329 Major depressive disorder, single episode, unspecified: Secondary | ICD-10-CM

## 2019-05-09 DIAGNOSIS — E785 Hyperlipidemia, unspecified: Secondary | ICD-10-CM

## 2019-05-09 DIAGNOSIS — F32A Depression, unspecified: Secondary | ICD-10-CM

## 2019-05-09 DIAGNOSIS — F419 Anxiety disorder, unspecified: Secondary | ICD-10-CM

## 2019-05-09 DIAGNOSIS — I10 Essential (primary) hypertension: Secondary | ICD-10-CM

## 2019-05-09 DIAGNOSIS — IMO0001 Reserved for inherently not codable concepts without codable children: Secondary | ICD-10-CM

## 2019-05-09 NOTE — Telephone Encounter (Signed)
Pt contacted and schedule for 05/18/2019 at 3:00. Pt to arrive through the [place] [facility] then proceed to [facility]. Nothing else needed at this time. Rhonda J Cobb

## 2019-05-18 ENCOUNTER — Ambulatory Visit: Payer: Medicare HMO

## 2019-05-18 ENCOUNTER — Other Ambulatory Visit: Payer: Self-pay

## 2019-05-18 DIAGNOSIS — G4733 Obstructive sleep apnea (adult) (pediatric): Secondary | ICD-10-CM | POA: Diagnosis not present

## 2019-05-18 DIAGNOSIS — G4719 Other hypersomnia: Secondary | ICD-10-CM

## 2019-05-23 DIAGNOSIS — G4733 Obstructive sleep apnea (adult) (pediatric): Secondary | ICD-10-CM | POA: Diagnosis not present

## 2019-05-24 ENCOUNTER — Telehealth: Payer: Self-pay | Admitting: Internal Medicine

## 2019-05-24 DIAGNOSIS — G4733 Obstructive sleep apnea (adult) (pediatric): Secondary | ICD-10-CM

## 2019-05-24 NOTE — Telephone Encounter (Signed)
Called and spoke to pt, and relayed below results.  Pt stated that he would like to discuss this with his spouse, and call us back.

## 2019-05-24 NOTE — Telephone Encounter (Signed)
Pt returned called [phone].

## 2019-05-24 NOTE — Telephone Encounter (Signed)
Pt is returning call. Pt states that he would like to move forward with ordering Cpap. Cb is [phone]

## 2019-05-24 NOTE — Telephone Encounter (Signed)
Order has been placed for cpap, as pt wished to proceed.  Pt will call back to schedule OV.  Nothing further is needed at this.

## 2019-05-24 NOTE — Telephone Encounter (Signed)
Left message with wife to have pt return call to discuss mild sleep apnea and the  recommendation to start auto-cpap with pressure range of 5-20 cm H2O. Will wait for return call.

## 2019-07-11 ENCOUNTER — Other Ambulatory Visit (INDEPENDENT_AMBULATORY_CARE_PROVIDER_SITE_OTHER): Payer: Medicare HMO

## 2019-07-11 ENCOUNTER — Other Ambulatory Visit: Payer: Self-pay | Admitting: Primary Care

## 2019-07-11 ENCOUNTER — Other Ambulatory Visit: Payer: Self-pay

## 2019-07-11 DIAGNOSIS — E119 Type 2 diabetes mellitus without complications: Secondary | ICD-10-CM

## 2019-07-11 LAB — POCT GLYCOSYLATED HEMOGLOBIN (HGB A1C): Hemoglobin A1C: 6.8 % — AB (ref 4.0–5.6)

## 2019-07-19 ENCOUNTER — Ambulatory Visit: Payer: Medicare HMO | Admitting: Internal Medicine

## 2019-07-22 ENCOUNTER — Ambulatory Visit: Payer: Medicare HMO | Admitting: Internal Medicine

## 2019-07-22 ENCOUNTER — Encounter: Payer: Self-pay | Admitting: Internal Medicine

## 2019-07-22 ENCOUNTER — Other Ambulatory Visit: Payer: Self-pay

## 2019-07-22 VITALS — BP 128/88 | HR 94 | Temp 97.2°F | Ht 78.0 in | Wt 332.0 lb

## 2019-07-22 DIAGNOSIS — G4733 Obstructive sleep apnea (adult) (pediatric): Secondary | ICD-10-CM

## 2019-07-22 NOTE — Progress Notes (Signed)
[facility] Consultation     Date: 07/22/2019  MRN# [id number] Nathan Krause 10-03-1971  Referring Physician: Referred by Allayne Gitelman.   Nathan Krause is a 48 y.o. old male seen in consultation for chief complaint of: snoring.    CC follow up snoring  HPI:   Patient with recent diagnosis of obstructive sleep apnea home sleep study showed AHI of 06 May 2019 Patient started on CPAP therapy 5 to 20 cm water pressure Patient had 130 episodes of desaturations  Patient is here for follow-up Sleep study results conveyed to the patient  Compliance report shows 100% compliance for days 87% compliance greater than 4 hours Patient AHI has decreased to 1.5  Patient snoring has completely resolved Patient no longer has morning headaches Excessive daytime sleepiness has resolved Patient has more energy   No evidence of heart failure at this time No evidence or signs of infection at this time No respiratory distress No fevers, chills, nausea, vomiting, diarrhea No evidence of lower extremity edema No evidence hemoptysis   PMHX:   Past Medical History:  Diagnosis Date  . Anxiety and depression   . Asthma   . DVT (deep venous thrombosis) (HCC)   . Essential hypertension   . Gout   . Hyperlipidemia   . Pulmonary embolism (HCC)   . Rheumatoid arthritis (HCC)   . Type 2 diabetes mellitus without complication [facility])    Surgical Hx:  Past Surgical History:  Procedure Laterality Date  . EXTRACORPOREAL SHOCK WAVE LITHOTRIPSY Left 06/03/2018   Procedure: EXTRACORPOREAL SHOCK WAVE LITHOTRIPSY (ESWL);  Surgeon: Vanna Scotland, MD;  Location: [facility] ORS;  Service: Urology;  Laterality: Left;  . KNEE ARTHROSCOPY Right   . KNEE SURGERY Left    Family Hx:  Family History  Problem Relation Age of Onset  . Heart disease Father   . Heart attack Father 24       deceased  . Colon cancer Mother 68  . Breast cancer Mother    Social Hx:   Social History   Tobacco Use  .  Smoking status: Never Smoker  . Smokeless tobacco: Current User    Types: Snuff  Substance Use Topics  . Alcohol use: Yes    Alcohol/week: 1.0 standard drinks    Types: 1 Cans of beer per week    Comment: social  . Drug use: Never   Medication:    Current Outpatient Medications:  .  albuterol (PROVENTIL HFA;VENTOLIN HFA) 108 (90 Base) MCG/ACT inhaler, Inhale 2 puffs into the lungs every 4 (four) hours as needed for wheezing or shortness of breath., Disp: 1 Inhaler, Rfl: 0 .  aspirin 81 MG tablet, Take 81 mg by mouth daily. , Disp: , Rfl:  .  citalopram (CELEXA) 20 MG tablet, TAKE 1 TABLET EVERY DAY, Disp: 90 tablet, Rfl: 2 .  glipiZIDE (GLUCOTROL XL) 5 MG 24 hr tablet, Take 1 tablet (5 mg total) by mouth daily with breakfast. For diabetes., Disp: 90 tablet, Rfl: 1 .  Glucosamine 500 MG CAPS, Take 1,000 mg by mouth daily. , Disp: , Rfl:  .  lisinopril (ZESTRIL) 10 MG tablet, TAKE 1 TABLET EVERY DAY, Disp: 90 tablet, Rfl: 2 .  metFORMIN (GLUCOPHAGE) 1000 MG tablet, TAKE 1 TABLET (1,000 MG TOTAL) BY MOUTH 2 (TWO) TIMES DAILY WITH MEALS., Disp: 180 tablet, Rfl: 2 .  Multiple Vitamin (MULTIVITAMIN) capsule, Take 1 capsule by mouth daily. , Disp: , Rfl:  .  sildenafil (REVATIO) 20 MG tablet, Take  2-5 tablets by mouth 30 minutes prior to intercourse., Disp: 50 tablet, Rfl: 0 .  simvastatin (ZOCOR) 40 MG tablet, TAKE 1 TABLET BY MOUTH EVERY EVENING FOR CHOLESTEROL., Disp: 90 tablet, Rfl: 2   Allergies:  Oxycodone-acetaminophen   Review of Systems:  Gen:  Denies  fever, sweats, chills weight loss  HEENT: Denies blurred vision, double vision, ear pain, eye pain, hearing loss, nose bleeds, sore throat Cardiac:  No dizziness, chest pain or heaviness, chest tightness,edema, No JVD Resp:   No cough, -sputum production, -shortness of breath,-wheezing, -hemoptysis,  Gi: Denies swallowing difficulty, stomach pain, nausea or vomiting, diarrhea, constipation, bowel incontinence Gu:  Denies bladder  incontinence, burning urine Ext:   Denies Joint pain, stiffness or swelling Skin: Denies  skin rash, easy bruising or bleeding or hives Endoc:  Denies polyuria, polydipsia , polyphagia or weight change Psych:   Denies depression, insomnia or hallucinations  Other:  All other systems negative   BP 128/88   Pulse 94   Temp (!) 97.2 F (36.2 C) (Temporal)   Ht 6\' 6"  (1.981 m)   Wt (!) 332 lb (150.6 kg)   SpO2 97%   BMI 38.37 kg/m       Physical Examination:   GENERAL:NAD, no fevers, chills, no weakness no fatigue HEAD: Normocephalic, atraumatic.  EYES: PERLA, EOMI No scleral icterus.  MOUTH: Moist mucosal membrane.  EAR, NOSE, THROAT: Clear without exudates. No external lesions.  NECK: Supple. No thyromegaly.  No JVD.  PULMONARY: CTA B/L no wheezing, rhonchi, crackles CARDIOVASCULAR: S1 and S2. Regular rate and rhythm. No murmurs GASTROINTESTINAL: Soft, nontender, nondistended. Positive bowel sounds.  MUSCULOSKELETAL: No swelling, clubbing, or edema.  NEUROLOGIC: No gross focal neurological deficits. 5/5 strength all extremities SKIN: No ulceration, lesions, rashes, or cyanosis.  PSYCHIATRIC: Insight, judgment intact. -depression -anxiety ALL OTHER ROS ARE NEGATIVE       Assessment and Plan:   Excessive daytime sleepiness gasping for air and snoring related to sleep apnea Continue CPAP as prescribed Auto CPAP 5 to 20 cm of pressure Excellent compliance report Continue as prescribed Follow-up in 6 months Patient uses and benefits from CPAP therapy   Obesity -recommend significant weight loss -recommend changing diet  Deconditioned state -Recommend increased daily activity and exercise   COVID-19 EDUCATION: The signs and symptoms of COVID-19 were discussed with the patient and how to seek care for testing.  The importance of social distancing was discussed today. Hand Washing Techniques and avoid touching face was advised.     MEDICATION  ADJUSTMENTS/LABS AND TESTS ORDERED: Continue CPAP as prescribed   CURRENT MEDICATIONS REVIEWED AT LENGTH WITH PATIENT TODAY   Patient satisfied with Plan of action and management. All questions answered  Follow up in 6 months   Ethyl Vila Patricia Pesa, M.D.  [facility]  Medical Director [facility] Director [facility]-P[facility] Department

## 2019-07-22 NOTE — Patient Instructions (Signed)
Continue CPAP as prescribed  Recommend weight loss   

## 2019-08-11 ENCOUNTER — Ambulatory Visit (INDEPENDENT_AMBULATORY_CARE_PROVIDER_SITE_OTHER): Payer: Medicare HMO

## 2019-08-11 DIAGNOSIS — Z23 Encounter for immunization: Secondary | ICD-10-CM

## 2019-10-03 ENCOUNTER — Other Ambulatory Visit: Payer: Self-pay | Admitting: Primary Care

## 2019-10-03 DIAGNOSIS — E119 Type 2 diabetes mellitus without complications: Secondary | ICD-10-CM

## 2019-10-10 ENCOUNTER — Other Ambulatory Visit: Payer: Self-pay

## 2019-10-10 ENCOUNTER — Ambulatory Visit (INDEPENDENT_AMBULATORY_CARE_PROVIDER_SITE_OTHER): Payer: Medicare HMO | Admitting: Primary Care

## 2019-10-10 ENCOUNTER — Encounter: Payer: Self-pay | Admitting: Primary Care

## 2019-10-10 VITALS — BP 122/82 | HR 82 | Temp 96.4°F | Ht 78.0 in | Wt 334.1 lb

## 2019-10-10 DIAGNOSIS — E119 Type 2 diabetes mellitus without complications: Secondary | ICD-10-CM | POA: Diagnosis not present

## 2019-10-10 LAB — POCT GLYCOSYLATED HEMOGLOBIN (HGB A1C): Hemoglobin A1C: 7.3 % — AB (ref 4.0–5.6)

## 2019-10-10 NOTE — Progress Notes (Signed)
Subjective:    Patient ID: Nathan Krause, male    DOB: 08-06-1971, 48 y.o.   MRN: [id number]  HPI  This visit occurred during the SARS-CoV-2 public health emergency.  Safety protocols were in place, including screening questions prior to the visit, additional usage of staff PPE, and extensive cleaning of exam room while observing appropriate contact time as indicated for disinfecting solutions.     Nathan Krause is a 48 year old male who presents today for follow up of diabetes.  Current medications include: Metformin 1000 mg BID, Glipizide XL 5 mg  Last A1C: 6.8 in September 2020, 7.3 today Last Eye Exam: Schedule for January 2021 Last Foot Exam: Due in June 2021 Pneumonia Vaccination: Due in 2021 ACE/ARB: Lisinopril  Statin: Simvastatin   BP Readings from Last 3 Encounters:  10/10/19 122/82  07/22/19 128/88  04/08/19 122/82    He endorses a terrible diet over the last three months, more fast food/take out food, increase in sodas. He is not exercising.     Review of Systems  Eyes: Negative for visual disturbance.  Respiratory: Negative for shortness of breath.   Cardiovascular: Negative for chest pain.  Neurological: Negative for dizziness and headaches.       Past Medical History:  Diagnosis Date  . Anxiety and depression   . Asthma   . DVT (deep venous thrombosis) (HCC)   . Essential hypertension   . Gout   . Hyperlipidemia   . Pulmonary embolism (HCC)   . Rheumatoid arthritis (HCC)   . Type 2 diabetes mellitus without complication (HCC)      Social History   Socioeconomic History  . Marital status: Married    Spouse name: Not on file  . Number of children: Not on file  . Years of education: Not on file  . Highest education level: Not on file  Occupational History  . Occupation: Disabled  Tobacco Use  . Smoking status: Never Smoker  . Smokeless tobacco: Current User    Types: Snuff  Substance and Sexual Activity  . Alcohol use: Yes   Alcohol/week: 1.0 standard drinks    Types: 1 Cans of beer per week    Comment: social  . Drug use: Never  . Sexual activity: Yes    Birth control/protection: None  Other Topics Concern  . Not on file  Social History Narrative   Married.   2 children.   Disabled and does not work.   Enjoys watching movies, working out, spending time with dogs, traveling.   Social Determinants of Health   Financial Resource Strain:   . Difficulty of Paying Living Expenses: Not on file  Food Insecurity:   . Worried About Programme researcher, broadcasting/film/video in the Last Year: Not on file  . Ran Out of Food in the Last Year: Not on file  Transportation Needs:   . Lack of Transportation (Medical): Not on file  . Lack of Transportation (Non-Medical): Not on file  Physical Activity:   . Days of Exercise per Week: Not on file  . Minutes of Exercise per Session: Not on file  Stress:   . Feeling of Stress : Not on file  Social Connections:   . Frequency of Communication with Friends and Family: Not on file  . Frequency of Social Gatherings with Friends and Family: Not on file  . Attends Religious Services: Not on file  . Active Member of Clubs or Organizations: Not on file  . Attends Banker  Meetings: Not on file  . Marital Status: Not on file  Intimate Partner Violence:   . Fear of Current or Ex-Partner: Not on file  . Emotionally Abused: Not on file  . Physically Abused: Not on file  . Sexually Abused: Not on file    Past Surgical History:  Procedure Laterality Date  . EXTRACORPOREAL SHOCK WAVE LITHOTRIPSY Left 06/03/2018   Procedure: EXTRACORPOREAL SHOCK WAVE LITHOTRIPSY (ESWL);  Surgeon: Hollice Espy, MD;  Location: [facility] ORS;  Service: Urology;  Laterality: Left;  . KNEE ARTHROSCOPY Right   . KNEE SURGERY Left     Family History  Problem Relation Age of Onset  . Heart disease Father   . Heart attack Father 60       deceased  . Colon cancer Mother 1  . Breast cancer Mother      Allergies  Allergen Reactions  . Oxycodone-Acetaminophen Nausea Only    Current Outpatient Medications on File Prior to Visit  Medication Sig Dispense Refill  . albuterol (PROVENTIL HFA;VENTOLIN HFA) 108 (90 Base) MCG/ACT inhaler Inhale 2 puffs into the lungs every 4 (four) hours as needed for wheezing or shortness of breath. 1 Inhaler 0  . aspirin 81 MG tablet Take 81 mg by mouth daily.     . citalopram (CELEXA) 20 MG tablet TAKE 1 TABLET EVERY DAY 90 tablet 2  . glipiZIDE (GLUCOTROL XL) 5 MG 24 hr tablet TAKE 1 TABLET (5 MG TOTAL) BY MOUTH DAILY WITH BREAKFAST FOR DIABETES. 90 tablet 1  . Glucosamine 500 MG CAPS Take 1,000 mg by mouth daily.     Marland Kitchen lisinopril (ZESTRIL) 10 MG tablet TAKE 1 TABLET EVERY DAY 90 tablet 2  . metFORMIN (GLUCOPHAGE) 1000 MG tablet TAKE 1 TABLET (1,000 MG TOTAL) BY MOUTH 2 (TWO) TIMES DAILY WITH MEALS. 180 tablet 2  . Multiple Vitamin (MULTIVITAMIN) capsule Take 1 capsule by mouth daily.     . sildenafil (REVATIO) 20 MG tablet Take 2-5 tablets by mouth 30 minutes prior to intercourse. 50 tablet 0  . simvastatin (ZOCOR) 40 MG tablet TAKE 1 TABLET BY MOUTH EVERY EVENING FOR CHOLESTEROL. 90 tablet 2   No current facility-administered medications on file prior to visit.    BP 122/82   Pulse 82   Temp (!) 96.4 F (35.8 C) (Temporal)   Ht 6\' 6"  (1.981 m)   Wt (!) 334 lb 1 oz (151.5 kg)   SpO2 98%   BMI 38.60 kg/m    Objective:   Physical Exam  Constitutional: He appears well-nourished.  Cardiovascular: Normal rate and regular rhythm.  Respiratory: Effort normal and breath sounds normal.  Musculoskeletal:     Cervical back: Neck supple.  Skin: Skin is warm and dry.  Psychiatric: He has a normal mood and affect.           Assessment & Plan:

## 2019-10-10 NOTE — Assessment & Plan Note (Signed)
Increase in A1C to 7.3 today, likely from poor diet and lack of regular exercise.  Discussed to work on both.  Continue current regimen of Metformin and Glipizide. Managed on statin and ACE. Eye exam scheduled for January 2021. Foot exam UTD. Pneumonia vaccination UTD.  Follow up in 6 months.

## 2019-10-10 NOTE — Patient Instructions (Signed)
It is important that you improve your diet. Please limit carbohydrates in the form of white bread, rice, pasta, sweets, fast food, fried food, sugary drinks, etc. Increase your consumption of fresh fruits and vegetables, whole grains, lean protein.  Ensure you are consuming 64 ounces of water daily.  Start exercising. You should be getting 150 minutes of moderate intensity exercise weekly.  Please schedule a follow up appointment in 6 months for your general follow up.  It was a pleasure to see you today!

## 2019-11-03 ENCOUNTER — Other Ambulatory Visit: Payer: Self-pay | Admitting: Primary Care

## 2019-11-03 DIAGNOSIS — J4520 Mild intermittent asthma, uncomplicated: Secondary | ICD-10-CM

## 2019-12-18 ENCOUNTER — Other Ambulatory Visit: Payer: Self-pay

## 2019-12-18 ENCOUNTER — Emergency Department
Admission: EM | Admit: 2019-12-18 | Discharge: 2019-12-18 | Disposition: A | Discharge status: Home / Self Care | Payer: Medicare HMO | Attending: Emergency Medicine | Admitting: Emergency Medicine

## 2019-12-18 ENCOUNTER — Emergency Department: Payer: Medicare HMO

## 2019-12-18 DIAGNOSIS — J45909 Unspecified asthma, uncomplicated: Secondary | ICD-10-CM | POA: Insufficient documentation

## 2019-12-18 DIAGNOSIS — R110 Nausea: Secondary | ICD-10-CM | POA: Diagnosis not present

## 2019-12-18 DIAGNOSIS — I10 Essential (primary) hypertension: Secondary | ICD-10-CM | POA: Insufficient documentation

## 2019-12-18 DIAGNOSIS — F17228 Nicotine dependence, chewing tobacco, with other nicotine-induced disorders: Secondary | ICD-10-CM | POA: Insufficient documentation

## 2019-12-18 DIAGNOSIS — E119 Type 2 diabetes mellitus without complications: Secondary | ICD-10-CM | POA: Insufficient documentation

## 2019-12-18 DIAGNOSIS — Z7982 Long term (current) use of aspirin: Secondary | ICD-10-CM | POA: Insufficient documentation

## 2019-12-18 DIAGNOSIS — N200 Calculus of kidney: Secondary | ICD-10-CM | POA: Diagnosis not present

## 2019-12-18 DIAGNOSIS — Z7984 Long term (current) use of oral hypoglycemic drugs: Secondary | ICD-10-CM | POA: Diagnosis not present

## 2019-12-18 DIAGNOSIS — Z79899 Other long term (current) drug therapy: Secondary | ICD-10-CM | POA: Diagnosis not present

## 2019-12-18 DIAGNOSIS — R319 Hematuria, unspecified: Secondary | ICD-10-CM | POA: Diagnosis not present

## 2019-12-18 DIAGNOSIS — R1031 Right lower quadrant pain: Secondary | ICD-10-CM | POA: Diagnosis present

## 2019-12-18 LAB — CBC
HCT: 41.3 % (ref 39.0–52.0)
Hemoglobin: 14.1 g/dL (ref 13.0–17.0)
MCH: 29.9 pg (ref 26.0–34.0)
MCHC: 34.1 g/dL (ref 30.0–36.0)
MCV: 87.5 fL (ref 80.0–100.0)
Platelets: 249 10*3/uL (ref 150–400)
RBC: 4.72 MIL/uL (ref 4.22–5.81)
RDW: 12.4 % (ref 11.5–15.5)
WBC: 10.9 10*3/uL — ABNORMAL HIGH (ref 4.0–10.5)
nRBC: 0 % (ref 0.0–0.2)

## 2019-12-18 LAB — COMPREHENSIVE METABOLIC PANEL
ALT: 38 U/L (ref 0–44)
AST: 34 U/L (ref 15–41)
Albumin: 4.4 g/dL (ref 3.5–5.0)
Alkaline Phosphatase: 51 U/L (ref 38–126)
Anion gap: 13 (ref 5–15)
BUN: 13 mg/dL (ref 6–20)
CO2: 21 mmol/L — ABNORMAL LOW (ref 22–32)
Calcium: 9.2 mg/dL (ref 8.9–10.3)
Chloride: 102 mmol/L (ref 98–111)
Creatinine, Ser: 1.29 mg/dL — ABNORMAL HIGH (ref 0.61–1.24)
GFR calc Af Amer: >60 mL/min (ref >60)
GFR calc non Af Amer: >60 mL/min (ref >60)
Glucose, Bld: 189 mg/dL — ABNORMAL HIGH (ref 70–99)
Potassium: 4.4 mmol/L (ref 3.5–5.1)
Sodium: 136 mmol/L (ref 135–145)
Total Bilirubin: 1.1 mg/dL (ref 0.3–1.2)
Total Protein: 7.6 g/dL (ref 6.5–8.1)

## 2019-12-18 MED ORDER — MORPHINE SULFATE (PF) 4 MG/ML IV SOLN
4.0000 mg | Freq: Once | INTRAVENOUS | Status: AC
  Administered 2019-12-18: 4 mg via INTRAVENOUS
  Filled 2019-12-18: qty 1

## 2019-12-18 MED ORDER — HYDROMORPHONE HCL 1 MG/ML IJ SOLN
INTRAMUSCULAR | Status: AC
  Filled 2019-12-18: qty 1

## 2019-12-18 MED ORDER — KETOROLAC TROMETHAMINE 30 MG/ML IJ SOLN
INTRAMUSCULAR | Status: AC
  Filled 2019-12-18: qty 1

## 2019-12-18 MED ORDER — ONDANSETRON 4 MG PO TBDP: 4.0000 mg | ORAL_TABLET | Freq: Three times a day (TID) | ORAL | 0 refills | Status: DC | PRN

## 2019-12-18 MED ORDER — HYDROCODONE-ACETAMINOPHEN 5-325 MG PO TABS: 1.0000 | ORAL_TABLET | ORAL | 0 refills | Status: DC | PRN

## 2019-12-18 MED ORDER — ONDANSETRON HCL 4 MG/2ML IJ SOLN
4.0000 mg | Freq: Once | INTRAMUSCULAR | Status: AC
  Administered 2019-12-18: 4 mg via INTRAVENOUS
  Filled 2019-12-18: qty 2

## 2019-12-18 MED ORDER — KETOROLAC TROMETHAMINE 30 MG/ML IJ SOLN
30.0000 mg | Freq: Once | INTRAMUSCULAR | Status: AC
  Administered 2019-12-18: 03:00:00 30 mg via INTRAVENOUS

## 2019-12-18 MED ORDER — TAMSULOSIN HCL 0.4 MG PO CAPS: 0.4000 mg | ORAL_CAPSULE | Freq: Every day | ORAL | 0 refills | Status: AC

## 2019-12-18 MED ORDER — HYDROMORPHONE HCL 1 MG/ML IJ SOLN
1.0000 mg | Freq: Once | INTRAMUSCULAR | Status: AC
  Administered 2019-12-18: 1 mg via INTRAVENOUS

## 2019-12-18 NOTE — ED Notes (Signed)
Pt to ct 

## 2019-12-18 NOTE — ED Triage Notes (Signed)
Pt with onset of right flank pain, vomiting, diarrhea with pain in rlq that began suddenly at 2030.

## 2019-12-18 NOTE — ED Provider Notes (Signed)
[facility] Emergency Department Provider Note  ____________________________________________   First MD Initiated Contact with Patient 12/18/19 0211     (approximate)  I have reviewed the triage vital signs and the nursing notes.   HISTORY  Chief Complaint   HPI Nathan Krause is a 49 y.o. male with below list of previous medical conditions including previous kidney stones with last stone requiring lithotripsy presents to the emergency department secondary to acute onset of right flank/right lower quadrant abdominal pain which began at 10:30 PM tonight.  Patient states that current pain score is 8 out of 10 with associated nausea.  Patient also admits to hematuria noted at home.  No dysuria.       Past Medical History:  Diagnosis Date   Anxiety and depression    Asthma    DVT (deep venous thrombosis) (HCC)    Essential hypertension    Gout    Hyperlipidemia    Pulmonary embolism (HCC)    Rheumatoid arthritis (HCC)    Type 2 diabetes mellitus without complication [facility])     Patient Active Problem List   Diagnosis Date Noted   Erectile dysfunction 04/08/2019   Snoring 08/06/2018   Right ankle pain 05/11/2018   Preventative health care 03/19/2016   Type 2 diabetes mellitus without complication, without long-term current use of insulin (HCC) 08/03/2015   Anxiety and depression 08/03/2015   Hyperlipidemia 08/03/2015   Essential hypertension 08/03/2015   Gout 08/03/2015   Asthma 08/08/2013   Depression (emotion) 05/04/2012   Environmental allergies 05/04/2012   Hearing loss on right 05/04/2012   History of DVT (deep vein thrombosis) 05/04/2012   History of knee surgery 05/04/2012   Osteoarthritis 05/04/2012   RA (rheumatoid arthritis) (HCC) 05/04/2012    Past Surgical History:  Procedure Laterality Date   EXTRACORPOREAL SHOCK WAVE LITHOTRIPSY Left 06/03/2018   Procedure: EXTRACORPOREAL SHOCK WAVE LITHOTRIPSY (ESWL);   Surgeon: Vanna Scotland, MD;  Location: [facility] ORS;  Service: Urology;  Laterality: Left;   KNEE ARTHROSCOPY Right    KNEE SURGERY Left     Prior to Admission medications   Medication Sig Start Date End Date Taking? Authorizing Provider  aspirin 81 MG tablet Take 81 mg by mouth daily.     [provider]  citalopram (CELEXA) 20 MG tablet TAKE 1 TABLET EVERY DAY 05/10/19   Doreene Nest, NP  glipiZIDE (GLUCOTROL XL) 5 MG 24 hr tablet TAKE 1 TABLET (5 MG TOTAL) BY MOUTH DAILY WITH BREAKFAST FOR DIABETES. 10/04/19   Doreene Nest, NP  Glucosamine 500 MG CAPS Take 1,000 mg by mouth daily.     [provider]  lisinopril (ZESTRIL) 10 MG tablet TAKE 1 TABLET EVERY DAY 05/10/19   Doreene Nest, NP  metFORMIN (GLUCOPHAGE) 1000 MG tablet TAKE 1 TABLET (1,000 MG TOTAL) BY MOUTH 2 (TWO) TIMES DAILY WITH MEALS. 05/10/19   Doreene Nest, NP  Multiple Vitamin (MULTIVITAMIN) capsule Take 1 capsule by mouth daily.     [provider]  sildenafil (REVATIO) 20 MG tablet Take 2-5 tablets by mouth 30 minutes prior to intercourse. 04/08/19   Doreene Nest, NP  simvastatin (ZOCOR) 40 MG tablet TAKE 1 TABLET BY MOUTH EVERY EVENING FOR CHOLESTEROL. 05/10/19   Doreene Nest, NP  VENTOLIN HFA 108 (90 Base) MCG/ACT inhaler INHALE 2 PUFFS INTO THE LUNGS EVERY 4 (FOUR) HOURS AS NEEDED FOR WHEEZING OR SHORTNESS OF BREATH. 11/04/19   Doreene Nest, NP  Allergies Oxycodone-acetaminophen  Family History  Problem Relation Age of Onset   Heart disease Father    Heart attack Father 61       deceased   Colon cancer Mother 70   Breast cancer Mother     Social History Social History   Tobacco Use   Smoking status: Never Smoker   Smokeless tobacco: Current User    Types: Snuff  Substance Use Topics   Alcohol use: Yes    Alcohol/week: 1.0 standard drinks    Types: 1 Cans of beer per week    Comment: social   Drug use: Never    Review of  Systems Constitutional: No fever/chills Eyes: No visual changes. ENT: No sore throat. Cardiovascular: Denies chest pain. Respiratory: Denies shortness of breath. Gastrointestinal: No abdominal pain.  No nausea, no vomiting.  No diarrhea.  No constipation. Genitourinary: Negative for dysuria. Musculoskeletal: Negative for neck pain.  Negative for back pain. Integumentary: Negative for rash. Neurological: Negative for headaches, focal weakness or numbness.   ____________________________________________   PHYSICAL EXAM:  VITAL SIGNS: ED Triage Vitals  Enc Vitals Group     BP 12/18/19 0207 (!) 151/90     Pulse Rate 12/18/19 0207 74     Resp 12/18/19 0207 20     Temp 12/18/19 0208 98.7 F (37.1 C)     Temp Source 12/18/19 0207 Oral     SpO2 12/18/19 0207 96 %     Weight 12/18/19 0209 (!) 140.6 kg (310 lb)     Height 12/18/19 0209 1.981 m (6\' 6" )     Head Circumference --      Peak Flow --      Pain Score 12/18/19 0209 8     Pain Loc --      Pain Edu? --      Excl. in GC? --     Constitutional: Alert and oriented.  Eyes: Conjunctivae are normal.  Head: Atraumatic. Mouth/Throat: Patient is wearing a mask. Neck: No stridor.  No meningeal signs.   Cardiovascular: Normal rate, regular rhythm. Good peripheral circulation. Grossly normal heart sounds. Respiratory: Normal respiratory effort.  No retractions. Gastrointestinal: Soft and nontender. No distention.  Musculoskeletal: No lower extremity tenderness nor edema. No gross deformities of extremities. Neurologic:  Normal speech and language. No gross focal neurologic deficits are appreciated.  Skin:  Skin is warm, dry and intact. Psychiatric: Mood and affect are normal. Speech and behavior are normal.  ____________________________________________   LABS (all labs ordered are listed, but only abnormal results are displayed)  Labs Reviewed  CBC - Abnormal; Notable for the following components:      Result Value   WBC  10.9 (*)    All other components within normal limits  COMPREHENSIVE METABOLIC PANEL - Abnormal; Notable for the following components:   CO2 21 (*)    Glucose, Bld 189 (*)    Creatinine, Ser 1.29 (*)    All other components within normal limits  URINALYSIS, COMPLETE (UACMP) WITH MICROSCOPIC     RADIOLOGY I, Lathrop N Jeda Pardue, personally viewed and evaluated these images (plain radiographs) as part of my medical decision making, as well as reviewing the written report by the radiologist.  ED MD interpretation: 4 x 4 mm stone in the distal right ureter with mild hydronephrosis and perinephric edema  Official radiology report(s): CT Renal Stone Study  Result Date: 12/18/2019 CLINICAL DATA:  Right flank pain. Hematuria. History of kidney stones. EXAM: CT ABDOMEN AND PELVIS WITHOUT CONTRAST TECHNIQUE:  Multidetector CT imaging of the abdomen and pelvis was performed following the standard protocol without IV contrast. COMPARISON:  06/01/2018 FINDINGS: Lower chest: Stable punctate subpleural nodule in the right lower lobe. No acute findings. Hepatobiliary: Diffuse hepatic steatosis. No evidence of focal lesion on noncontrast exam. Gallbladder physiologically distended, no calcified stone. No biliary dilatation. Pancreas: No ductal dilatation or inflammation. Spleen: Stable small low-density lesions. Adrenals/Urinary Tract: Normal adrenal glands. Obstructing 4 x 4 mm stone in the distal right ureter approximately 2 cm proximal to the ureterovesicular junction. Mild proximal hydroureteronephrosis with perinephric edema. Multiple, at least 4, additional nonobstructing stones in the right kidney. No left hydronephrosis or ureteral calculi. Multiple, at least 6, nonobstructing stones in the left kidney. The urinary bladder is near completely empty. No bladder stone. Stomach/Bowel: Stomach partially distended. No bowel wall thickening or inflammation. No obstruction. Normal appendix. Mild distal colonic  diverticulosis without diverticulitis. Vascular/Lymphatic: Abdominal aorta is normal in caliber. No aneurysm. Prominent portal caval node is likely reactive. Reproductive: Prostate is unremarkable. Other: Small fat containing umbilical hernia. Fat in both inguinal canals. No ascites or free air. Musculoskeletal: Degenerative change in the lumbar spine. Scattered bone islands in the pelvis. There are no acute or suspicious osseous abnormalities. IMPRESSION: 1. Obstructing 4 x 4 mm stone in the distal right ureter with mild hydroureteronephrosis and perinephric edema. 2. Multiple additional nonobstructing stones in both kidneys. 3. Incidental findings of hepatic steatosis, mild colonic diverticulosis, and small fat containing umbilical hernia. Electronically Signed   By: Narda Rutherford M.D.   On: 12/18/2019 02:35        Procedures   ____________________________________________   INITIAL IMPRESSION / MDM / ASSESSMENT AND PLAN / ED COURSE  As part of my medical decision making, I reviewed the following data within the electronic MEDICAL RECORD NUMBER   49 year old male presented with above-stated history and physical exam with differential diagnosis including but not limited to kidney stone versus appendicitis.  CT scan consistent with a 4 x 4 millimeter obstructing distal right ureter stone.  Patient initially given IV morphine 4 mg and Zofran 4 mg with minimal improvement of pain.  Patient subsequently given 1 mg of IV Dilaudid and 30 mg of IV Toradol with resolution of pain at this time.  Patient be prescribed Percocet Flomax and Zofran for home.  Patient referred to urology    ____________________________________________  FINAL CLINICAL IMPRESSION(S) / ED DIAGNOSES  Final diagnoses:  Right kidney stone     MEDICATIONS GIVEN DURING THIS VISIT:  Medications  morphine 4 MG/ML injection 4 mg (4 mg Intravenous Given 12/18/19 0213)  ondansetron (ZOFRAN) injection 4 mg (4 mg Intravenous Given  12/18/19 0213)  HYDROmorphone (DILAUDID) injection 1 mg (1 mg Intravenous Given 12/18/19 0244)  ketorolac (TORADOL) 30 MG/ML injection 30 mg (30 mg Intravenous Given 12/18/19 0244)     ED Discharge Orders    None      *Please note:  Nathan Krause was evaluated in Emergency Department on 12/18/2019 for the symptoms described in the history of present illness. He was evaluated in the context of the global COVID-19 pandemic, which necessitated consideration that the patient might be at risk for infection with the SARS-CoV-2 virus that causes COVID-19. Institutional protocols and algorithms that pertain to the evaluation of patients at risk for COVID-19 are in a state of rapid change based on information released by regulatory bodies including the CDC and federal and state organizations. These policies and algorithms were followed during the patient's care  in the ED.  Some ED evaluations and interventions may be delayed as a result of limited staffing during the pandemic.*  Note:  This document was prepared using Dragon voice recognition software and may include unintentional dictation errors.   Gregor Hams, MD 12/18/19 [phone]

## 2019-12-21 ENCOUNTER — Ambulatory Visit
Admission: RE | Admit: 2019-12-21 | Discharge: 2019-12-21 | Disposition: A | Discharge status: Home / Self Care | Payer: Medicare HMO | Source: Ambulatory Visit | Attending: Urology | Admitting: Urology

## 2019-12-21 ENCOUNTER — Ambulatory Visit (INDEPENDENT_AMBULATORY_CARE_PROVIDER_SITE_OTHER): Payer: Medicare HMO | Admitting: Urology

## 2019-12-21 ENCOUNTER — Other Ambulatory Visit: Payer: Self-pay

## 2019-12-21 ENCOUNTER — Other Ambulatory Visit: Payer: Self-pay | Admitting: Urology

## 2019-12-21 ENCOUNTER — Encounter: Payer: Self-pay | Admitting: Urology

## 2019-12-21 VITALS — BP 142/88 | HR 68 | Ht 78.0 in | Wt 339.0 lb

## 2019-12-21 DIAGNOSIS — N132 Hydronephrosis with renal and ureteral calculous obstruction: Secondary | ICD-10-CM

## 2019-12-21 DIAGNOSIS — N201 Calculus of ureter: Secondary | ICD-10-CM

## 2019-12-21 NOTE — Progress Notes (Signed)
12/21/2019 9:07 AM   Nathan Krause 1971-06-19 [id number]  Referring provider: Pleas Koch, NP [place],  [place] [id number]  Chief Complaint  Patient presents with  . Nephrolithiasis    HPI: Nathan Krause is a 49 yo M with a history of previous kidney stones returns today for a f/u for the evaluation and management of kidney stones.   He had successful shockwave lithotripsy in 2019.  Pt reported to the ED on 12/18/19 for acute onset of right flank/right LQ abdominal pain which began at 10:30 PM. He rated his pain score 8 out of 10 with associated nausea. He also reported of hematuria which was resolved.   CT showed an obstructing 4 x 4 mm stone in distal right ureter with mild hydronephrosis and perinephric edema. Multiple additional nonobstructing stones in both kidneys present.   Pt reports of back pain. He has tried pain pills and had no relief of symptoms.   He denies seeing the stone pass.    He denies F/C and urinary issues improved with Flomax.    PMH: Past Medical History:  Diagnosis Date  . Anxiety and depression   . Asthma   . DVT (deep venous thrombosis) ([place])   . Essential hypertension   . Gout   . Hyperlipidemia   . Pulmonary embolism ([place])   . Rheumatoid arthritis ([place])   . Type 2 diabetes mellitus without complication [facility])     Surgical History: Past Surgical History:  Procedure Laterality Date  . EXTRACORPOREAL SHOCK WAVE LITHOTRIPSY Left 06/03/2018   Procedure: EXTRACORPOREAL SHOCK WAVE LITHOTRIPSY (ESWL);  Surgeon: Hollice Espy, MD;  Location: [facility] ORS;  Service: Urology;  Laterality: Left;  . KNEE ARTHROSCOPY Right   . KNEE SURGERY Left     Home Medications:  Allergies as of 12/21/2019      Reactions   Oxycodone-acetaminophen Nausea Only      Medication List       Accurate as of December 21, 2019 11:59 PM. If you have any questions, ask your nurse or doctor.        aspirin 81 MG tablet Take 81 mg by mouth daily.    citalopram 20 MG tablet Commonly known as: CELEXA TAKE 1 TABLET EVERY DAY   glipiZIDE 5 MG 24 hr tablet Commonly known as: GLUCOTROL XL TAKE 1 TABLET (5 MG TOTAL) BY MOUTH DAILY WITH BREAKFAST FOR DIABETES.   Glucosamine 500 MG Caps Take 1,000 mg by mouth daily.   HYDROcodone-acetaminophen 5-325 MG tablet Commonly known as: NORCO/VICODIN Take 1 tablet by mouth every 4 (four) hours as needed for moderate pain.   lisinopril 10 MG tablet Commonly known as: ZESTRIL TAKE 1 TABLET EVERY DAY   metFORMIN 1000 MG tablet Commonly known as: GLUCOPHAGE TAKE 1 TABLET (1,000 MG TOTAL) BY MOUTH 2 (TWO) TIMES DAILY WITH MEALS.   multivitamin capsule Take 1 capsule by mouth daily.   ondansetron 4 MG disintegrating tablet Commonly known as: Zofran ODT Take 1 tablet (4 mg total) by mouth every 8 (eight) hours as needed.   sildenafil 20 MG tablet Commonly known as: REVATIO Take 2-5 tablets by mouth 30 minutes prior to intercourse.   simvastatin 40 MG tablet Commonly known as: ZOCOR TAKE 1 TABLET BY MOUTH EVERY EVENING FOR CHOLESTEROL.   tamsulosin 0.4 MG Caps capsule Commonly known as: Flomax Take 1 capsule (0.4 mg total) by mouth daily for 7 days.   Ventolin HFA 108 (90 Base) MCG/ACT inhaler Generic drug:  albuterol INHALE 2 PUFFS INTO THE LUNGS EVERY 4 (FOUR) HOURS AS NEEDED FOR WHEEZING OR SHORTNESS OF BREATH.       Allergies:  Allergies  Allergen Reactions  . Oxycodone-Acetaminophen Nausea Only    Family History: Family History  Problem Relation Age of Onset  . Heart disease Father   . Heart attack Father 17       deceased  . Colon cancer Mother 64  . Breast cancer Mother     Social History:  reports that he has never smoked. His smokeless tobacco use includes snuff. He reports current alcohol use of about 1.0 standard drinks of alcohol per week. He reports that he does not use drugs.   Physical Exam: BP (!) 142/88   Pulse 68   Ht 6\' 6"  (1.981 m)   Wt (!)  339 lb (153.8 kg)   BMI 39.18 kg/m   Constitutional:  Alert and oriented, No acute distress. HEENT: [place] AT, moist mucus membranes.  Trachea midline, no masses. Cardiovascular: No clubbing, cyanosis, or edema. Respiratory: Normal respiratory effort, no increased work of breathing. Skin: No rashes, bruises or suspicious lesions. Neurologic: Grossly intact, no focal deficits, moving all 4 extremities. Psychiatric: Normal mood and affect.  Laboratory Data: Lab Results  Component Value Date   WBC 10.9 (H) 12/18/2019   HGB 14.1 12/18/2019   HCT 41.3 12/18/2019   MCV 87.5 12/18/2019   PLT 249 12/18/2019    Lab Results  Component Value Date   CREATININE 1.29 (H) 12/18/2019    Lab Results  Component Value Date   HGBA1C 7.3 (A) 10/10/2019    Urinalysis UA reviewed, bland appearing, see epic.  No blood.  Pertinent Imaging: Results for orders placed during the hospital encounter of 12/18/19  CT Renal Stone Study   Narrative CLINICAL DATA:  Right flank pain. Hematuria. History of kidney stones.  EXAM: CT ABDOMEN AND PELVIS WITHOUT CONTRAST  TECHNIQUE: Multidetector CT imaging of the abdomen and pelvis was performed following the standard protocol without IV contrast.  COMPARISON:  06/01/2018  FINDINGS: Lower chest: Stable punctate subpleural nodule in the right lower lobe. No acute findings.  Hepatobiliary: Diffuse hepatic steatosis. No evidence of focal lesion on noncontrast exam. Gallbladder physiologically distended, no calcified stone. No biliary dilatation.  Pancreas: No ductal dilatation or inflammation.  Spleen: Stable small low-density lesions.  Adrenals/Urinary Tract: Normal adrenal glands.  Obstructing 4 x 4 mm stone in the distal right ureter approximately 2 cm proximal to the ureterovesicular junction. Mild proximal hydroureteronephrosis with perinephric edema. Multiple, at least 4, additional nonobstructing stones in the right kidney.  No left  hydronephrosis or ureteral calculi. Multiple, at least 6, nonobstructing stones in the left kidney. The urinary bladder is near completely empty. No bladder stone.  Stomach/Bowel: Stomach partially distended. No bowel wall thickening or inflammation. No obstruction. Normal appendix. Mild distal colonic diverticulosis without diverticulitis.  Vascular/Lymphatic: Abdominal aorta is normal in caliber. No aneurysm. Prominent portal caval node is likely reactive.  Reproductive: Prostate is unremarkable.  Other: Small fat containing umbilical hernia. Fat in both inguinal canals. No ascites or free air.  Musculoskeletal: Degenerative change in the lumbar spine. Scattered bone islands in the pelvis. There are no acute or suspicious osseous abnormalities.  IMPRESSION: 1. Obstructing 4 x 4 mm stone in the distal right ureter with mild hydroureteronephrosis and perinephric edema. 2. Multiple additional nonobstructing stones in both kidneys. 3. Incidental findings of hepatic steatosis, mild colonic diverticulosis, and small fat containing umbilical hernia.  Electronically Signed   By: Narda Rutherford M.D.   On: 12/18/2019 02:35    I have personally reviewed the images and agree with radiologist interpretation.   KUB was also ordered by me and interpreted as below.  KUB not well visualized and suggestive of interval passage vs. Occult stone vs. nonradioopaque stone.   Assessment & Plan:    1. Right urereterolithiasis / hydroureteronephrosis  4 mm stone visualized on CT  KUB not well visualized- as above  Discussed with pt of potential passing of stone w/o intervention, strong possibility based on size and location  We discussed various treatment options including ESWL vs. ureteroscopy. We discussed the risks and benefits of both including bleeding, infection, damage to surrounding structures, efficacy with need for possible further intervention, and need for temporary ureteral  stent.   Pt opted for passing of stone w/o intervention and will return if stone has not passed   Continue Flomax ' Warning symptoms reviewed   [facility] [address] [place], [place] [address] [phone]  I, Donne Hazel, am acting as a scribe for Dr. Vanna Scotland,  Vanna Scotland, MD  I spent 30 total minutes on the day of the encounter including pre-visit review of the medical record, face-to-face time with the patient, and post visit ordering of labs/imaging/tests.

## 2019-12-22 ENCOUNTER — Telehealth: Payer: Self-pay | Admitting: *Deleted

## 2019-12-22 LAB — MICROSCOPIC EXAMINATION: Bacteria, UA: NONE SEEN

## 2019-12-22 LAB — URINALYSIS, COMPLETE
Bilirubin, UA: NEGATIVE
Glucose, UA: NEGATIVE
Ketones, UA: NEGATIVE
Leukocytes,UA: NEGATIVE
Nitrite, UA: NEGATIVE
Protein,UA: NEGATIVE
Specific Gravity, UA: 1.02 (ref 1.005–1.030)
Urobilinogen, Ur: 0.2 mg/dL (ref 0.2–1.0)
pH, UA: 5.5 (ref 5.0–7.5)

## 2019-12-22 NOTE — Telephone Encounter (Addendum)
Patient informed, verbalized understanding.   ----- Message from Vanna Scotland, MD sent at 12/22/2019  7:44 AM EST ----- FYI- radiology reviewed x ray from yesterday and believe that they in fact can see the stone (its in a tricky spot up against your tail bone).  If you do end up needing treatment, we should be able to see it on the truck especially once we are able to view at different angles.    Let us know if you need our help and don't pass it on your own.  Vanna Scotland, MD

## 2020-01-18 ENCOUNTER — Telehealth: Payer: Self-pay | Admitting: Urology

## 2020-01-18 NOTE — Telephone Encounter (Signed)
Would you reach out to Nathan Krause and ask him if he passed his stone?

## 2020-01-27 ENCOUNTER — Other Ambulatory Visit: Payer: Self-pay | Admitting: Primary Care

## 2020-01-27 DIAGNOSIS — F419 Anxiety disorder, unspecified: Secondary | ICD-10-CM

## 2020-01-27 DIAGNOSIS — E119 Type 2 diabetes mellitus without complications: Secondary | ICD-10-CM

## 2020-01-27 DIAGNOSIS — I10 Essential (primary) hypertension: Secondary | ICD-10-CM

## 2020-01-27 DIAGNOSIS — F329 Major depressive disorder, single episode, unspecified: Secondary | ICD-10-CM

## 2020-01-27 DIAGNOSIS — E785 Hyperlipidemia, unspecified: Secondary | ICD-10-CM

## 2020-03-10 IMAGING — US US RENAL
1 series · 14 of 25 positions shown · non-contrast
Comparison: CT 06/01/2018

CLINICAL DATA: History of hydronephrosis

EXAM:
RENAL / URINARY TRACT ULTRASOUND COMPLETE

[Series 1: us renal · 0.23mm/px · 14 of 37 slices shown]
[im 1/37]
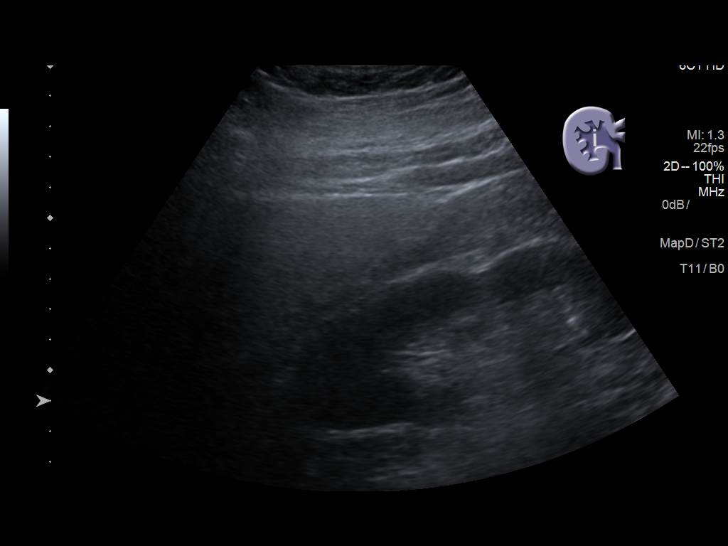
[im 4/37]
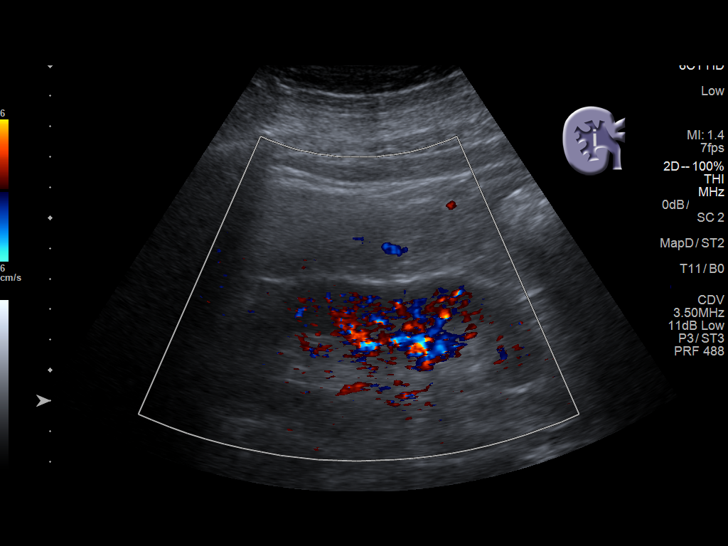
[im 7/37]
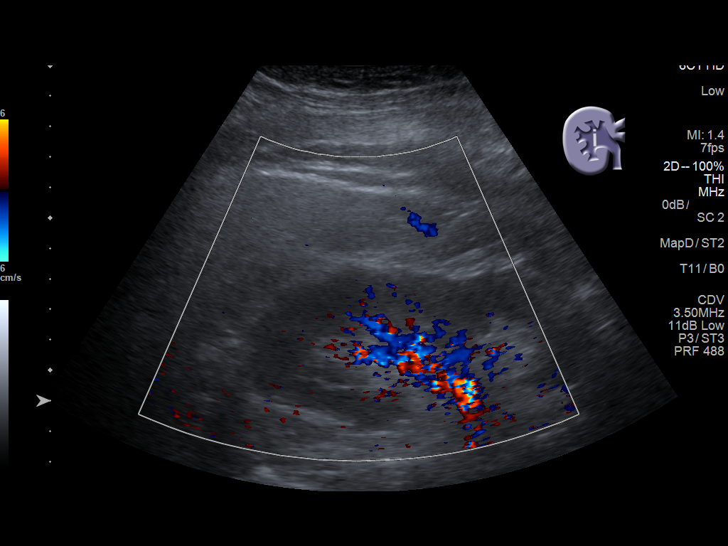
[im 10/37]
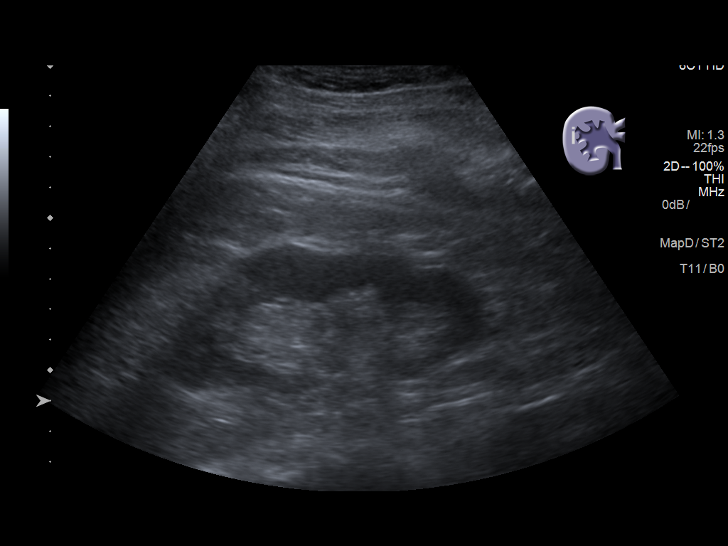
[im 13/37]
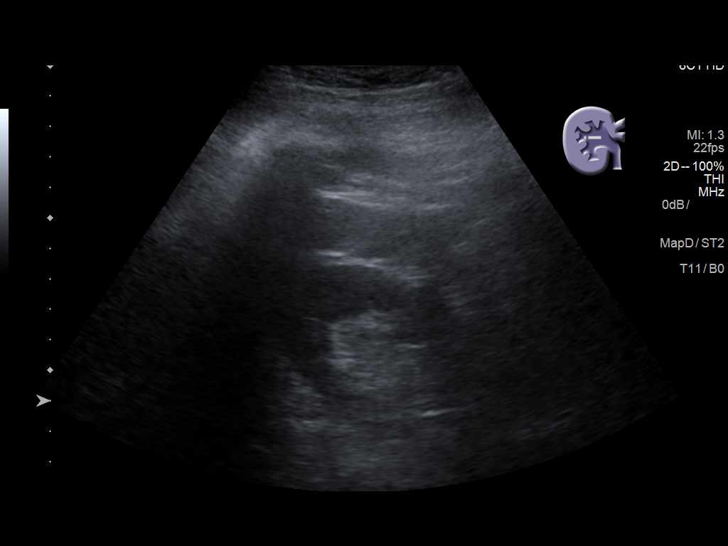
[im 14/37]
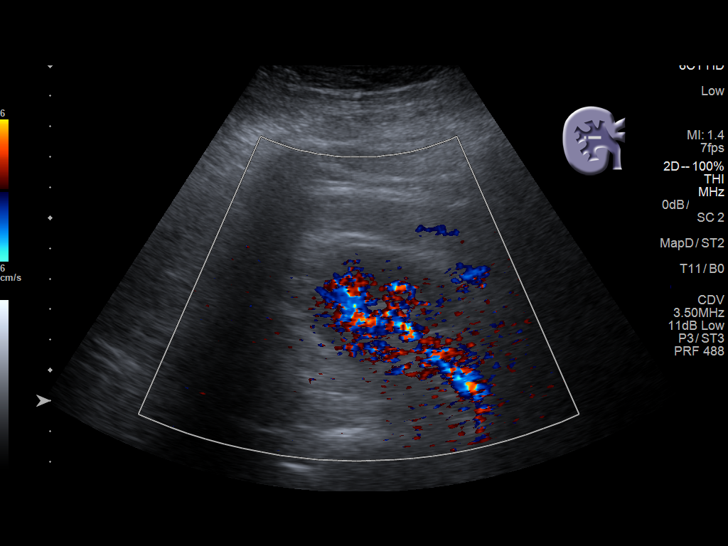
[im 17/37]
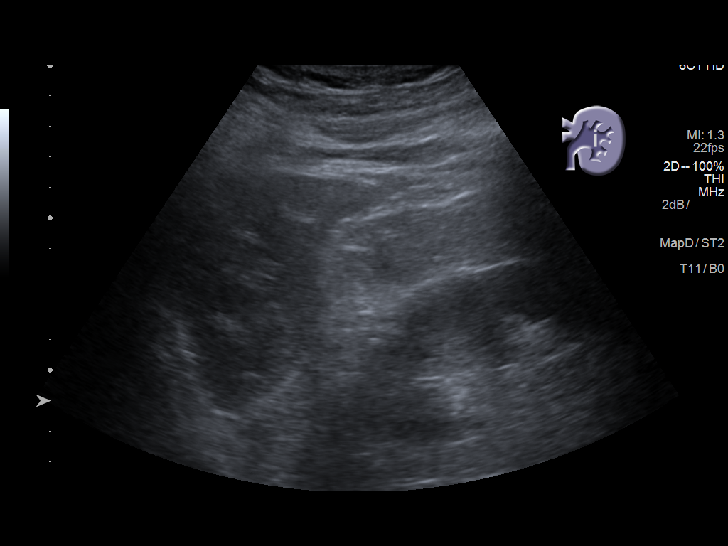
[im 20/37]
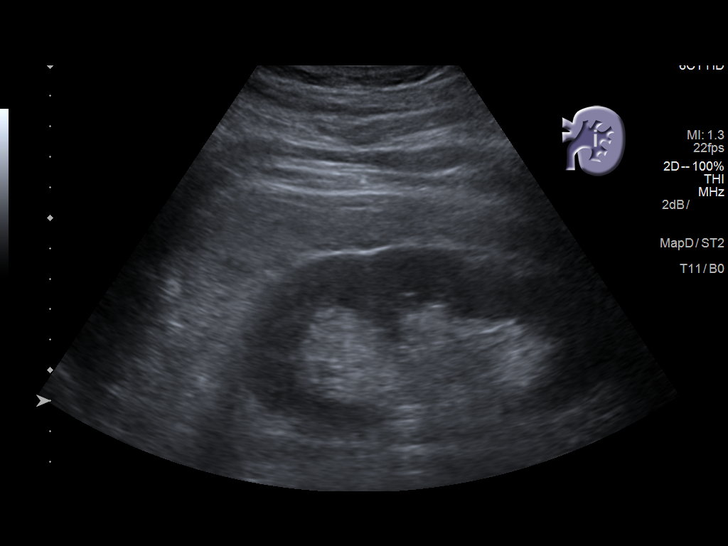
[im 23/37]
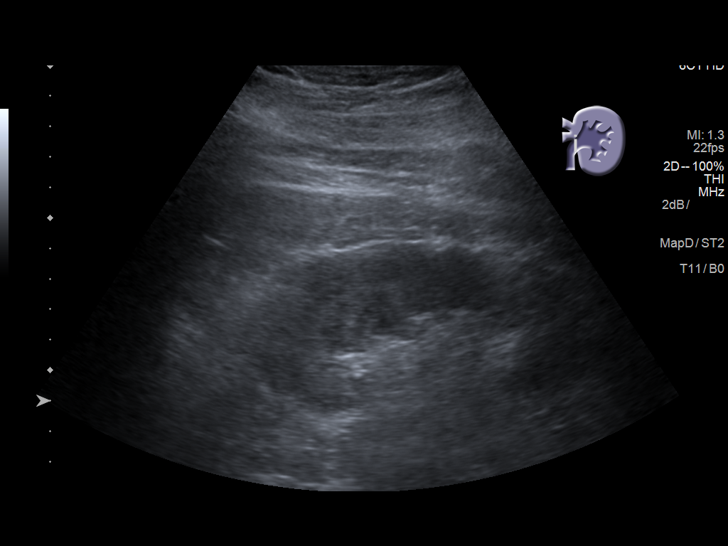
[im 25/37]
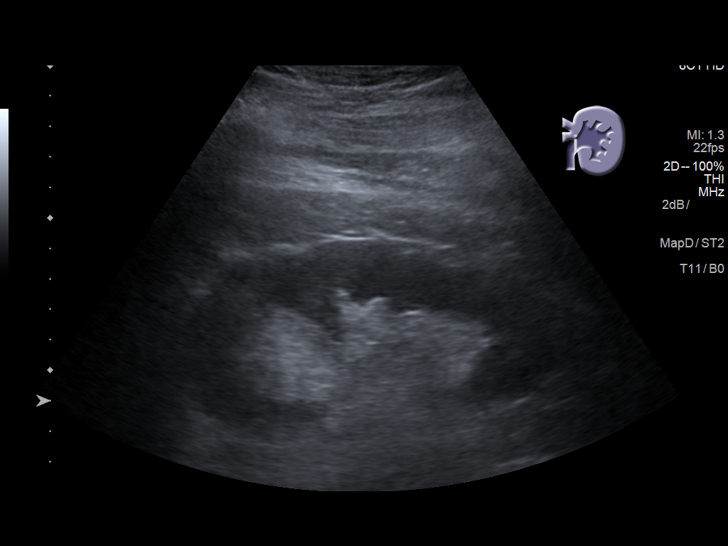
[im 28/37]
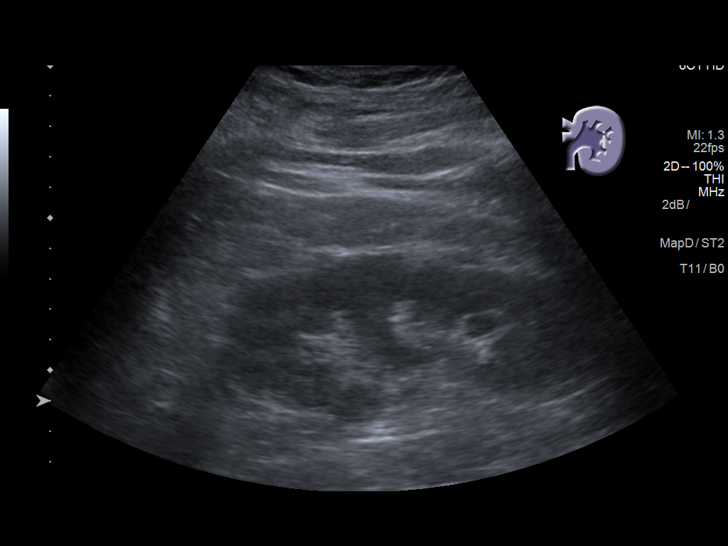
[im 31/37]
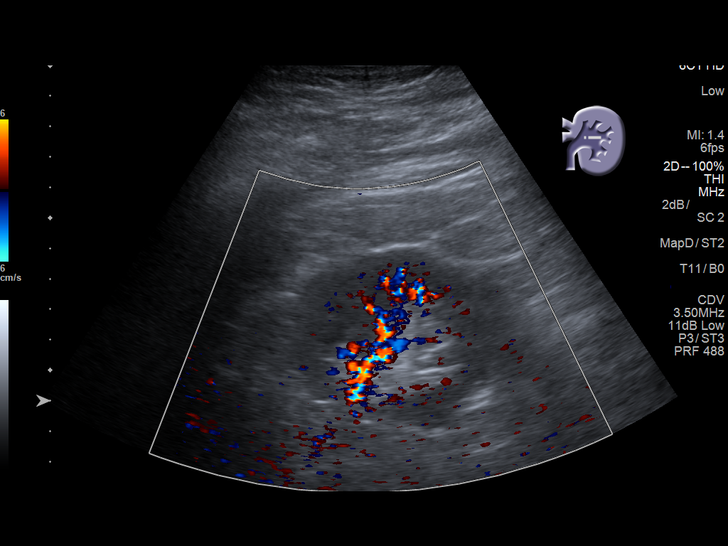
[im 34/37]
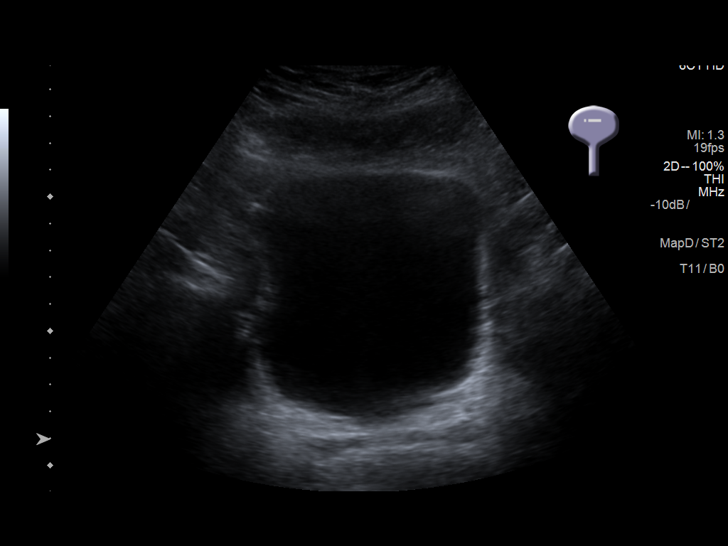
[im 37/37]
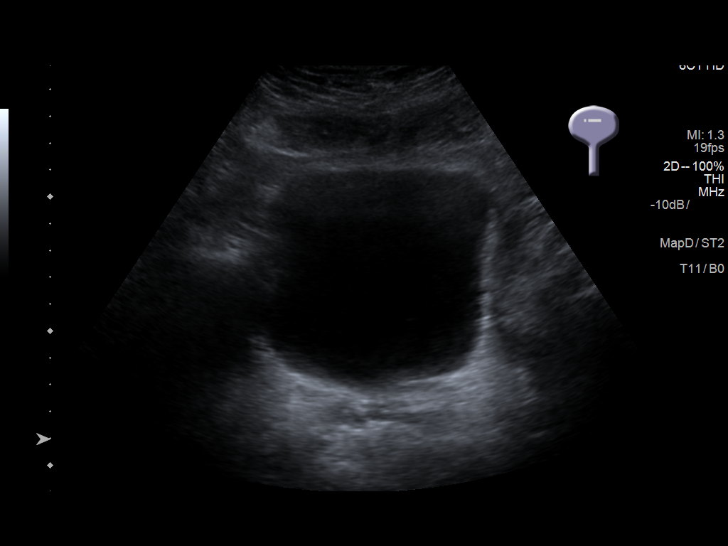

[14 of 25 positions shown; findings below may reference images not displayed]

FINDINGS: Right Kidney:

Length: 12.5 cm. Echogenicity within normal limits. No mass or
hydronephrosis visualized.

Left Kidney:

Length: 12.3 cm. Echogenicity within normal limits. No mass or
hydronephrosis visualized.

Bladder:

Appears normal for degree of bladder distention.
IMPRESSION: Negative for hydronephrosis.  Normal renal ultrasound

## 2020-03-28 ENCOUNTER — Other Ambulatory Visit: Payer: Self-pay | Admitting: Primary Care

## 2020-03-28 DIAGNOSIS — E119 Type 2 diabetes mellitus without complications: Secondary | ICD-10-CM

## 2020-03-28 DIAGNOSIS — E785 Hyperlipidemia, unspecified: Secondary | ICD-10-CM

## 2020-03-28 DIAGNOSIS — I10 Essential (primary) hypertension: Secondary | ICD-10-CM

## 2020-04-04 ENCOUNTER — Telehealth: Payer: Self-pay | Admitting: Urology

## 2020-04-04 NOTE — Telephone Encounter (Signed)
Would you call Mr. Prosperi and ask him if he ever passed his stone?

## 2020-04-05 NOTE — Telephone Encounter (Signed)
Patient states he did pass the stone.

## 2020-04-05 NOTE — Telephone Encounter (Signed)
LMOM for patient to return call.

## 2020-04-05 NOTE — Telephone Encounter (Signed)
Pt returned your call asks that you reach him on his cell at [phone].

## 2020-04-10 ENCOUNTER — Other Ambulatory Visit (INDEPENDENT_AMBULATORY_CARE_PROVIDER_SITE_OTHER): Payer: Medicare HMO

## 2020-04-10 ENCOUNTER — Other Ambulatory Visit: Payer: Self-pay

## 2020-04-10 ENCOUNTER — Telehealth: Payer: Self-pay

## 2020-04-10 ENCOUNTER — Ambulatory Visit: Payer: Medicare HMO

## 2020-04-10 DIAGNOSIS — E785 Hyperlipidemia, unspecified: Secondary | ICD-10-CM | POA: Diagnosis not present

## 2020-04-10 DIAGNOSIS — E119 Type 2 diabetes mellitus without complications: Secondary | ICD-10-CM | POA: Diagnosis not present

## 2020-04-10 DIAGNOSIS — I10 Essential (primary) hypertension: Secondary | ICD-10-CM | POA: Diagnosis not present

## 2020-04-10 LAB — COMPREHENSIVE METABOLIC PANEL
ALT: 34 U/L (ref 0–53)
AST: 25 U/L (ref 0–37)
Albumin: 4.4 g/dL (ref 3.5–5.2)
Alkaline Phosphatase: 50 U/L (ref 39–117)
BUN: 10 mg/dL (ref 6–23)
CO2: 28 mEq/L (ref 19–32)
Calcium: 9.5 mg/dL (ref 8.4–10.5)
Chloride: 104 mEq/L (ref 96–112)
Creatinine, Ser: 1.15 mg/dL (ref 0.40–1.50)
GFR: 67.48 mL/min (ref >60.00)
Glucose, Bld: 149 mg/dL — ABNORMAL HIGH (ref 70–99)
Potassium: 4.2 mEq/L (ref 3.5–5.1)
Sodium: 139 mEq/L (ref 135–145)
Total Bilirubin: 0.5 mg/dL (ref 0.2–1.2)
Total Protein: 7.2 g/dL (ref 6.0–8.3)

## 2020-04-10 LAB — LIPID PANEL
Cholesterol: 144 mg/dL (ref 0–200)
HDL: 37.5 mg/dL — ABNORMAL LOW (ref >39.00)
LDL Cholesterol: 68 mg/dL (ref 0–99)
NonHDL: 106.21
Total CHOL/HDL Ratio: 4
Triglycerides: 189 mg/dL — ABNORMAL HIGH (ref 0.0–149.0)
VLDL: 37.8 mg/dL (ref 0.0–40.0)

## 2020-04-10 LAB — HEMOGLOBIN A1C: Hgb A1c MFr Bld: 7.8 % — ABNORMAL HIGH (ref 4.6–6.5)

## 2020-04-10 NOTE — Telephone Encounter (Signed)
Called patient 3 times trying to complete his Medicare visit. Patient never answered. Left message on voicemail notifying patient appointment was cancelled and to call and reschedule or it will be completed at his upcoming physical.

## 2020-04-16 ENCOUNTER — Other Ambulatory Visit: Payer: Self-pay

## 2020-04-16 ENCOUNTER — Ambulatory Visit (INDEPENDENT_AMBULATORY_CARE_PROVIDER_SITE_OTHER): Payer: Medicare HMO | Admitting: Primary Care

## 2020-04-16 ENCOUNTER — Encounter: Payer: Self-pay | Admitting: Primary Care

## 2020-04-16 VITALS — BP 128/82 | HR 77 | Temp 96.4°F | Ht 78.0 in | Wt 332.0 lb

## 2020-04-16 DIAGNOSIS — Z0000 Encounter for general adult medical examination without abnormal findings: Secondary | ICD-10-CM | POA: Diagnosis not present

## 2020-04-16 DIAGNOSIS — F419 Anxiety disorder, unspecified: Secondary | ICD-10-CM

## 2020-04-16 DIAGNOSIS — I10 Essential (primary) hypertension: Secondary | ICD-10-CM

## 2020-04-16 DIAGNOSIS — J4520 Mild intermittent asthma, uncomplicated: Secondary | ICD-10-CM

## 2020-04-16 DIAGNOSIS — M1990 Unspecified osteoarthritis, unspecified site: Secondary | ICD-10-CM

## 2020-04-16 DIAGNOSIS — E785 Hyperlipidemia, unspecified: Secondary | ICD-10-CM

## 2020-04-16 DIAGNOSIS — F32A Depression, unspecified: Secondary | ICD-10-CM

## 2020-04-16 DIAGNOSIS — M1A9XX0 Chronic gout, unspecified, without tophus (tophi): Secondary | ICD-10-CM

## 2020-04-16 DIAGNOSIS — E119 Type 2 diabetes mellitus without complications: Secondary | ICD-10-CM

## 2020-04-16 DIAGNOSIS — F329 Major depressive disorder, single episode, unspecified: Secondary | ICD-10-CM

## 2020-04-16 DIAGNOSIS — N529 Male erectile dysfunction, unspecified: Secondary | ICD-10-CM

## 2020-04-16 DIAGNOSIS — G4733 Obstructive sleep apnea (adult) (pediatric): Secondary | ICD-10-CM

## 2020-04-16 NOTE — Assessment & Plan Note (Signed)
Infrequent use of inhaler, overall well managed. Continue PRN albuterol.

## 2020-04-16 NOTE — Assessment & Plan Note (Signed)
Immunizations UTD. PSA due next year. Colonoscopy due, he kindly declines and will think about this. Discussed the importance of a healthy diet and regular exercise in order for weight loss, and to reduce the risk of any potential medical problems.  Exam today stable. Labs reviewed.

## 2020-04-16 NOTE — Assessment & Plan Note (Signed)
Completed sleep study in September 2020, compliant to CPAP machine.

## 2020-04-16 NOTE — Patient Instructions (Signed)
Start exercising. You should be getting 150 minutes of moderate intensity exercise weekly.  It is important that you improve your diet. Please limit carbohydrates in the form of white bread, rice, pasta, sweets, fast food, fried food, sugary drinks, etc. Increase your consumption of fresh fruits and vegetables, whole grains, lean protein.  Ensure you are consuming 64 ounces of water daily.  Schedule your eye exam.  Think about the colonoscopy as discussed.  It was a pleasure to see you today!   Preventive Care 45-17 Years Old, Male Preventive care refers to lifestyle choices and visits with your health care provider that can promote health and wellness. This includes:  A yearly physical exam. This is also called an annual well check.  Regular dental and eye exams.  Immunizations.  Screening for certain conditions.  Healthy lifestyle choices, such as eating a healthy diet, getting regular exercise, not using drugs or products that contain nicotine and tobacco, and limiting alcohol use. What can I expect for my preventive care visit? Physical exam Your health care provider will check:  Height and weight. These may be used to calculate body mass index (BMI), which is a measurement that tells if you are at a healthy weight.  Heart rate and blood pressure.  Your skin for abnormal spots. Counseling Your health care provider may ask you questions about:  Alcohol, tobacco, and drug use.  Emotional well-being.  Home and relationship well-being.  Sexual activity.  Eating habits.  Work and work Statistician. What immunizations do I need?  Influenza (flu) vaccine  This is recommended every year. Tetanus, diphtheria, and pertussis (Tdap) vaccine  You may need a Td booster every 10 years. Varicella (chickenpox) vaccine  You may need this vaccine if you have not already been vaccinated. Zoster (shingles) vaccine  You may need this after age 10. Measles, mumps, and  rubella (MMR) vaccine  You may need at least one dose of MMR if you were born in 1957 or later. You may also need a second dose. Pneumococcal conjugate (PCV13) vaccine  You may need this if you have certain conditions and were not previously vaccinated. Pneumococcal polysaccharide (PPSV23) vaccine  You may need one or two doses if you smoke cigarettes or if you have certain conditions. Meningococcal conjugate (MenACWY) vaccine  You may need this if you have certain conditions. Hepatitis A vaccine  You may need this if you have certain conditions or if you travel or work in places where you may be exposed to hepatitis A. Hepatitis B vaccine  You may need this if you have certain conditions or if you travel or work in places where you may be exposed to hepatitis B. Haemophilus influenzae type b (Hib) vaccine  You may need this if you have certain risk factors. Human papillomavirus (HPV) vaccine  If recommended by your health care provider, you may need three doses over 6 months. You may receive vaccines as individual doses or as more than one vaccine together in one shot (combination vaccines). Talk with your health care provider about the risks and benefits of combination vaccines. What tests do I need? Blood tests  Lipid and cholesterol levels. These may be checked every 5 years, or more frequently if you are over 26 years old.  Hepatitis C test.  Hepatitis B test. Screening  Lung cancer screening. You may have this screening every year starting at age 24 if you have a 30-pack-year history of smoking and currently smoke or have quit within the past  15 years.  Prostate cancer screening. Recommendations will vary depending on your family history and other risks.  Colorectal cancer screening. All adults should have this screening starting at age 76 and continuing until age 6. Your health care provider may recommend screening at age 12 if you are at increased risk. You will have  tests every 1-10 years, depending on your results and the type of screening test.  Diabetes screening. This is done by checking your blood sugar (glucose) after you have not eaten for a while (fasting). You may have this done every 1-3 years.  Sexually transmitted disease (STD) testing. Follow these instructions at home: Eating and drinking  Eat a diet that includes fresh fruits and vegetables, whole grains, lean protein, and low-fat dairy products.  Take vitamin and mineral supplements as recommended by your health care provider.  Do not drink alcohol if your health care provider tells you not to drink.  If you drink alcohol: ? Limit how much you have to 0-2 drinks a day. ? Be aware of how much alcohol is in your drink. In the [place] one drink equals one 12 oz bottle of beer (355 mL), one 5 oz glass of wine (148 mL), or one 1 oz glass of hard liquor (44 mL). Lifestyle  Take daily care of your teeth and gums.  Stay active. Exercise for at least 30 minutes on 5 or more days each week.  Do not use any products that contain nicotine or tobacco, such as cigarettes, e-cigarettes, and chewing tobacco. If you need help quitting, ask your health care provider.  If you are sexually active, practice safe sex. Use a condom or other form of protection to prevent STIs (sexually transmitted infections).  Talk with your health care provider about taking a low-dose aspirin every day starting at age 78. What's next?  Go to your health care provider once a year for a well check visit.  Ask your health care provider how often you should have your eyes and teeth checked.  Stay up to date on all vaccines. This information is not intended to replace advice given to you by your health care provider. Make sure you discuss any questions you have with your health care provider. Document Revised: 10/07/2018 Document Reviewed: 10/07/2018 Elsevier Patient Education  2020 [employer].

## 2020-04-16 NOTE — Assessment & Plan Note (Addendum)
Borderline uncontrolled with an increase in A1C from 7.3 to 7.8 on recent labs. Would like to see him below 7.  He's been working on a healthier lifestyle over the last one month, would like some additional time to continue. Encouraged regular exercise and healthy diet.   Continue Glipizide XL 5 mg and Metformin 1000 mg BID.  Managed on statin and ACE. Foot exam today. He will schedule eye exam.  Follow up in 6 months.

## 2020-04-16 NOTE — Assessment & Plan Note (Signed)
Denies recent flares, continue to monitor.

## 2020-04-16 NOTE — Assessment & Plan Note (Signed)
Well controlled on citalopram, continue same.  

## 2020-04-16 NOTE — Assessment & Plan Note (Signed)
Well controlled on simvastatin, continue same.

## 2020-04-16 NOTE — Assessment & Plan Note (Signed)
Well controlled in the office today on lisinopril 10 mg. CMP reviewed. Continue lisinopril 10 mg.

## 2020-04-16 NOTE — Assessment & Plan Note (Signed)
Doing well on PRN sildenafil, continue same. 

## 2020-04-16 NOTE — Progress Notes (Signed)
Subjective:    Patient ID: Nathan Krause, male    DOB: [birth year], 49 y.o.   MRN: [id number]  HPI  This visit occurred during the SARS-CoV-2 public health emergency.  Safety protocols were in place, including screening questions prior to the visit, additional usage of staff PPE, and extensive cleaning of exam room while observing appropriate contact time as indicated for disinfecting solutions.   Nathan Krause is a 49 year old male who presents today for complete physical.  Immunizations: -Tetanus: Completed in 2018 -Influenza: Completed last season  -Pneumonia: Completed in 2016 -Covid-19: Undecided   Diet: He endorses a healthy diet. Exercise: He is walking a lot at work, some exercise.  Eye exam: Due, he will scheduled  Dental exam: No recent exam   Colonoscopy: Due, he declines   BP Readings from Last 3 Encounters:  04/16/20 128/82  12/21/19 (!) 142/88  12/18/19 (!) 141/89     Review of Systems  Constitutional: Negative for unexpected weight change.  HENT: Negative for rhinorrhea.   Respiratory: Negative for cough and shortness of breath.   Cardiovascular: Negative for chest pain.  Gastrointestinal: Negative for constipation and diarrhea.  Genitourinary: Negative for difficulty urinating.  Musculoskeletal: Negative for arthralgias.  Skin: Negative for rash.  Allergic/Immunologic: Negative for environmental allergies.  Neurological: Negative for dizziness, numbness and headaches.  Psychiatric/Behavioral: The patient is not nervous/anxious.        Past Medical History:  Diagnosis Date  . Anxiety and depression   . Asthma   . DVT (deep venous thrombosis) ([facility])   . Essential hypertension   . Gout   . Hyperlipidemia   . Pulmonary embolism ([facility])   . Rheumatoid arthritis ([facility])   . Type 2 diabetes mellitus without complication ([facility])      Social History   Socioeconomic History  . Marital status: Married    Spouse name: Not on file  . Number of children: Not on  file  . Years of education: Not on file  . Highest education level: Not on file  Occupational History  . Occupation: Disabled  Tobacco Use  . Smoking status: Never Smoker  . Smokeless tobacco: Current User    Types: Snuff  Vaping Use  . Vaping Use: Never used  Substance and Sexual Activity  . Alcohol use: Yes    Alcohol/week: 1.0 standard drink    Types: 1 Cans of beer per week    Comment: social  . Drug use: Never  . Sexual activity: Yes    Birth control/protection: None  Other Topics Concern  . Not on file  Social History Narrative   Married.   2 children.   Disabled and does not work.   Enjoys watching movies, working out, spending time with dogs, traveling.   Social Determinants of Health   Financial Resource Strain:   . Difficulty of Paying Living Expenses:   Food Insecurity:   . Worried About Programme researcher, broadcasting/film/video in the Last Year:   . Barista in the Last Year:   Transportation Needs:   . Freight forwarder (Medical):   Marland Kitchen Lack of Transportation (Non-Medical):   Physical Activity:   . Days of Exercise per Week:   . Minutes of Exercise per Session:   Stress:   . Feeling of Stress :   Social Connections:   . Frequency of Communication with Friends and Family:   . Frequency of Social Gatherings with Friends and Family:   . Attends Religious Services:   .  Active Member of Clubs or Organizations:   . Attends Archivist Meetings:   Marland Kitchen Marital Status:   Intimate Partner Violence:   . Fear of Current or Ex-Partner:   . Emotionally Abused:   Marland Kitchen Physically Abused:   . Sexually Abused:     Past Surgical History:  Procedure Laterality Date  . EXTRACORPOREAL SHOCK WAVE LITHOTRIPSY Left 06/03/2018   Procedure: EXTRACORPOREAL SHOCK WAVE LITHOTRIPSY (ESWL);  Surgeon: Hollice Espy, MD;  Location: [facility] ORS;  Service: Urology;  Laterality: Left;  . KNEE ARTHROSCOPY Right   . KNEE SURGERY Left     Family History  Problem Relation Age of Onset  .  Heart disease Father   . Heart attack Father 83       deceased  . Colon cancer Mother 37  . Breast cancer Mother     Allergies  Allergen Reactions  . Oxycodone-Acetaminophen Nausea Only    Current Outpatient Medications on File Prior to Visit  Medication Sig Dispense Refill  . aspirin 81 MG tablet Take 81 mg by mouth daily.     . citalopram (CELEXA) 20 MG tablet TAKE 1 TABLET EVERY DAY 90 tablet 0  . glipiZIDE (GLUCOTROL XL) 5 MG 24 hr tablet TAKE 1 TABLET (5 MG TOTAL) BY MOUTH DAILY WITH BREAKFAST FOR DIABETES. 90 tablet 0  . Glucosamine 500 MG CAPS Take 1,000 mg by mouth daily.     Marland Kitchen lisinopril (ZESTRIL) 10 MG tablet TAKE 1 TABLET EVERY DAY 90 tablet 0  . metFORMIN (GLUCOPHAGE) 1000 MG tablet TAKE 1 TABLET TWICE DAILY WITH MEALS 180 tablet 0  . Multiple Vitamin (MULTIVITAMIN) capsule Take 1 capsule by mouth daily.     . sildenafil (REVATIO) 20 MG tablet Take 2-5 tablets by mouth 30 minutes prior to intercourse. 50 tablet 0  . simvastatin (ZOCOR) 40 MG tablet TAKE 1 TABLET BY MOUTH EVERY EVENING FOR CHOLESTEROL. 90 tablet 0  . VENTOLIN HFA 108 (90 Base) MCG/ACT inhaler INHALE 2 PUFFS INTO THE LUNGS EVERY 4 (FOUR) HOURS AS NEEDED FOR WHEEZING OR SHORTNESS OF BREATH. 18 g 0   No current facility-administered medications on file prior to visit.    BP 128/82   Pulse 77   Temp (!) 96.4 F (35.8 C) (Temporal)   Ht 6\' 6"  (1.981 m)   Wt (!) 332 lb (150.6 kg)   SpO2 98%   BMI 38.37 kg/m    Objective:   Physical Exam  Constitutional: He is oriented to person, place, and time.  HENT:  Right Ear: Tympanic membrane and ear canal normal.  Left Ear: Tympanic membrane and ear canal normal.  Eyes: Pupils are equal, round, and reactive to light.  Cardiovascular: Normal rate and regular rhythm.  Respiratory: Effort normal and breath sounds normal.  GI: Soft. Bowel sounds are normal. There is no abdominal tenderness.  Musculoskeletal:        General: Normal range of motion.      Cervical back: Neck supple.  Neurological: He is alert and oriented to person, place, and time. No cranial nerve deficit.  Reflex Scores:      Patellar reflexes are 2+ on the right side and 2+ on the left side. Skin: Skin is warm and dry.  Psychiatric: Mood normal.           Assessment & Plan:

## 2020-04-16 NOTE — Assessment & Plan Note (Signed)
No concerns today. Denies joint swelling.

## 2020-04-29 ENCOUNTER — Other Ambulatory Visit: Payer: Self-pay | Admitting: Primary Care

## 2020-04-29 DIAGNOSIS — E119 Type 2 diabetes mellitus without complications: Secondary | ICD-10-CM

## 2020-04-29 DIAGNOSIS — F419 Anxiety disorder, unspecified: Secondary | ICD-10-CM

## 2020-04-29 DIAGNOSIS — E785 Hyperlipidemia, unspecified: Secondary | ICD-10-CM

## 2020-04-29 DIAGNOSIS — I10 Essential (primary) hypertension: Secondary | ICD-10-CM

## 2020-07-18 ENCOUNTER — Encounter: Payer: Self-pay | Admitting: Family Medicine

## 2020-07-18 ENCOUNTER — Telehealth: Payer: Self-pay

## 2020-07-18 NOTE — Telephone Encounter (Signed)
Fine with me, very nice patient. 

## 2020-07-18 NOTE — Telephone Encounter (Signed)
Ok for transfer? 

## 2020-07-18 NOTE — Telephone Encounter (Signed)
Transfer request   Pt is requesting to transfer FROM: Nathan Rieger, NP Pt is requesting to transfer TO: Dr. Milinda Cave Reason for requested transfer: Location - patient has recently moved to [facility] area Best contact number: [phone]  Thank you, Annabelle Harman

## 2020-07-18 NOTE — Telephone Encounter (Signed)
Yes ok with me

## 2020-07-19 NOTE — Telephone Encounter (Signed)
Please assist with scheduling, thanks.

## 2020-07-19 NOTE — Telephone Encounter (Signed)
Patient scheduled on 10/12 with Dr. Milinda Cave.

## 2020-08-07 ENCOUNTER — Other Ambulatory Visit: Payer: Self-pay

## 2020-08-07 ENCOUNTER — Encounter: Payer: Self-pay | Admitting: Family Medicine

## 2020-08-07 ENCOUNTER — Ambulatory Visit: Payer: Medicare HMO | Admitting: Family Medicine

## 2020-08-07 VITALS — BP 115/77 | HR 78 | Temp 97.7°F | Resp 16 | Ht 78.0 in | Wt 320.6 lb

## 2020-08-07 DIAGNOSIS — I10 Essential (primary) hypertension: Secondary | ICD-10-CM | POA: Diagnosis not present

## 2020-08-07 DIAGNOSIS — E119 Type 2 diabetes mellitus without complications: Secondary | ICD-10-CM

## 2020-08-07 DIAGNOSIS — E7800 Pure hypercholesterolemia, unspecified: Secondary | ICD-10-CM | POA: Diagnosis not present

## 2020-08-07 DIAGNOSIS — Z23 Encounter for immunization: Secondary | ICD-10-CM | POA: Diagnosis not present

## 2020-08-07 LAB — BASIC METABOLIC PANEL
BUN: 11 mg/dL (ref 6–23)
CO2: 29 mEq/L (ref 19–32)
Calcium: 9.5 mg/dL (ref 8.4–10.5)
Chloride: 103 mEq/L (ref 96–112)
Creatinine, Ser: 1.11 mg/dL (ref 0.40–1.50)
GFR: 77.17 mL/min (ref >60.00)
Glucose, Bld: 127 mg/dL — ABNORMAL HIGH (ref 70–99)
Potassium: 4.2 mEq/L (ref 3.5–5.1)
Sodium: 140 mEq/L (ref 135–145)

## 2020-08-07 LAB — HEMOGLOBIN A1C: Hgb A1c MFr Bld: 7 % — ABNORMAL HIGH (ref 4.6–6.5)

## 2020-08-07 NOTE — Addendum Note (Signed)
Addended by: Emi Holes D on: 08/07/2020 09:45 AM   Modules accepted: Orders

## 2020-08-07 NOTE — Progress Notes (Signed)
Office Note 08/07/2020  CC:  Chief Complaint  Patient presents with  . Establish Care    Previous PCP, Vernona Rieger NP    HPI:  Nathan Krause is a 49 y.o. White male who is here to establish/transfer care ([place]; pt moved to [facility]).  Old records in EPIC/HL EMR were reviewed prior to or during today's visit. He's feeling well. Currently working full time in Theatre stage manager, also teaching comp sci at [employer], also going to school online for [employer] in Office manager and information systems.  DM: A1c 7.03 April 2020.  On metformin and glipizide. No home gluc monitoring.  He has hypoglyc awareness, occ feels this and he eats and is fine----"happens sporatically". Not really trying diabetic diet b/c he "felt terrible" on it, labile glucoses.  HTN: lisinopril 10mg  qd.   Occ home bp check is <130/80.  HLD: zocor 40mg  qd.  Tolerates this med well.  Diet: has cut back signif on fast food.  Still drinks soda quite a bit. Has lost 12 lbs in last 4 mo.  ROS: no fevers, no CP, no SOB, no wheezing, no cough, no dizziness, no HAs, no rashes, no melena/hematochezia.  No polyuria or polydipsia.  No myalgias or arthralgias.  No focal weakness, paresthesias, or tremors.  No acute vision or hearing abnormalities. No n/v/d or abd pain.  No palpitations.    Past Medical History:  Diagnosis Date  . Anxiety and depression   . Asthma   . DVT (deep venous thrombosis) (HCC)    in context of surgery  . Essential hypertension   . Gout   . History of blood transfusion   . Hyperlipidemia   . OSA (obstructive sleep apnea)    on CPAP  . Osteoarthritis   . Pulmonary embolism (HCC)    in context of surgery  . Rheumatoid arthritis (HCC)    rheum not completely sure; possible simply osteoarth.  Was on DMARD at one point time.  . Type 2 diabetes mellitus without complication (HCC)   . Ureterolithiasis    L and R    Past Surgical History:  Procedure Laterality Date  .  EXTRACORPOREAL SHOCK WAVE LITHOTRIPSY Left 06/03/2018   Procedure: EXTRACORPOREAL SHOCK WAVE LITHOTRIPSY (ESWL);  Surgeon: , MD;  Location: [facility] ORS;  Service: Urology;  Laterality: Left;  . KNEE ARTHROSCOPY Right   . KNEE SURGERY Left     Family History  Problem Relation Age of Onset  . Heart disease Father   . Heart attack Father 60       deceased  . Colon cancer Mother 36  . Breast cancer Mother   . Diabetes Paternal Grandmother     Social History   Socioeconomic History  . Marital status: Married    Spouse name: Not on file  . Number of children: Not on file  . Years of education: Not on file  . Highest education level: Not on file  Occupational History  . Occupation: Disabled  Tobacco Use  . Smoking status: Never Smoker  . Smokeless tobacco: Current User    Types: Snuff  Vaping Use  . Vaping Use: Never used  Substance and Sexual Activity  . Alcohol use: Yes    Alcohol/week: 1.0 standard drink    Types: 1 Cans of beer per week    Comment: social  . Drug use: Never  . Sexual activity: Yes    Birth control/protection: None  Other Topics Concern  . Not on file  Social History Narrative   Married.   2 children.   Educ: BA in information systems and cybersecurity.   Occup: Warden/ranger. Disabled for a while due to knee/mobility issues--returned to work 02/2020.   No tob.   Alc: occ beer.   Enjoys watching movies, working out, spending time with dogs, traveling.   Social Determinants of Health   Financial Resource Strain:   . Difficulty of Paying Living Expenses: Not on file  Food Insecurity:   . Worried About Programme researcher, broadcasting/film/video in the Last Year: Not on file  . Ran Out of Food in the Last Year: Not on file  Transportation Needs:   . Lack of Transportation (Medical): Not on file  . Lack of Transportation (Non-Medical): Not on file  Physical Activity:   . Days of Exercise per Week: Not on file  . Minutes of Exercise per Session: Not on  file  Stress:   . Feeling of Stress : Not on file  Social Connections:   . Frequency of Communication with Friends and Family: Not on file  . Frequency of Social Gatherings with Friends and Family: Not on file  . Attends Religious Services: Not on file  . Active Member of Clubs or Organizations: Not on file  . Attends Banker Meetings: Not on file  . Marital Status: Not on file  Intimate Partner Violence:   . Fear of Current or Ex-Partner: Not on file  . Emotionally Abused: Not on file  . Physically Abused: Not on file  . Sexually Abused: Not on file    Outpatient Encounter Medications as of 08/07/2020  Medication Sig  . aspirin 81 MG tablet Take 81 mg by mouth daily.   . citalopram (CELEXA) 20 MG tablet TAKE 1 TABLET EVERY DAY  . glipiZIDE (GLUCOTROL XL) 5 MG 24 hr tablet TAKE 1 TABLET (5 MG TOTAL) BY MOUTH DAILY WITH BREAKFAST FOR DIABETES.  . Glucosamine 500 MG CAPS Take 1,000 mg by mouth daily.   Marland Kitchen lisinopril (ZESTRIL) 10 MG tablet TAKE 1 TABLET EVERY DAY  . loratadine (CLARITIN) 10 MG tablet Take 10 mg by mouth daily as needed for allergies.  . metFORMIN (GLUCOPHAGE) 1000 MG tablet TAKE 1 TABLET TWICE DAILY WITH MEALS  . Multiple Vitamin (MULTIVITAMIN) capsule Take 1 capsule by mouth daily.   . simvastatin (ZOCOR) 40 MG tablet TAKE 1 TABLET BY MOUTH EVERY EVENING FOR CHOLESTEROL.  Marland Kitchen sildenafil (REVATIO) 20 MG tablet Take 2-5 tablets by mouth 30 minutes prior to intercourse. (Patient not taking: Reported on 08/07/2020)  . VENTOLIN HFA 108 (90 Base) MCG/ACT inhaler INHALE 2 PUFFS INTO THE LUNGS EVERY 4 (FOUR) HOURS AS NEEDED FOR WHEEZING OR SHORTNESS OF BREATH. (Patient not taking: Reported on 08/07/2020)   No facility-administered encounter medications on file as of 08/07/2020.    Allergies  Allergen Reactions  . Oxycodone-Acetaminophen Nausea Only    PE; Blood pressure 115/77, pulse 78, temperature 97.7 F (36.5 C), temperature source Oral, resp. rate 16,  height 6\' 6"  (1.981 m), weight (!) 320 lb 9.6 oz (145.4 kg), SpO2 98 %. Gen: Alert, well appearing.  Patient is oriented to person, place, time, and situation. AFFECT: pleasant, lucid thought and speech. : no injection, icteris, swelling, or exudate.  EOMI, PERRLA. Mouth: lips without lesion/swelling.  Oral mucosa pink and moist. Oropharynx without erythema, exudate, or swelling.  CV: RRR, no m/r/g.   LUNGS: CTA bilat, nonlabored resps, good aeration in all lung fields. ABD: soft, NT, ND, BS  normal.  No hepatospenomegaly or mass.  No bruits. EXT: no clubbing or cyanosis.  no edema.   Pertinent labs:   Lab Results  Component Value Date   LABURIC 8.6 (H) 03/17/2018    Lab Results  Component Value Date   WBC 10.9 (H) 12/18/2019   HGB 14.1 12/18/2019   HCT 41.3 12/18/2019   MCV 87.5 12/18/2019   PLT 249 12/18/2019   Lab Results  Component Value Date   CREATININE 1.15 04/10/2020   BUN 10 04/10/2020   NA 139 04/10/2020   K 4.2 04/10/2020   CL 104 04/10/2020   CO2 28 04/10/2020   Lab Results  Component Value Date   ALT 34 04/10/2020   AST 25 04/10/2020   ALKPHOS 50 04/10/2020   BILITOT 0.5 04/10/2020   Lab Results  Component Value Date   CHOL 144 04/10/2020   Lab Results  Component Value Date   HDL 37.50 (L) 04/10/2020   Lab Results  Component Value Date   LDLCALC 68 04/10/2020   Lab Results  Component Value Date   TRIG 189.0 (H) 04/10/2020   Lab Results  Component Value Date   CHOLHDL 4 04/10/2020   Lab Results  Component Value Date   HGBA1C 7.8 (H) 04/10/2020   ASSESSMENT AND PLAN:   Transfer/establish care.  1) DM: w/out comp. He has eye appt scheduled. Feet exam UTD. A1c, BMET today. Continue glipizide and metformin.  2) HTN: well controlled. Continue lisinopril 10 mg qd. BMET today.  3) HLD: tolerating statin (simva 40mg --continue this). Diet improving. Last lipid panel 4 mo ago showed LDL 68, trigs just a tad high. Hepatic panel  normal at that time. Plan repeat FLP and hepatic panel 6 mo.  4) Preventative health: Vaccines; Tdap UTD.  Last pneumovax was 5 yrs ago, repeat recommended->he'll put off for now since getting flu vaccine today.  Flu->given today.  Covid 19->he has declined thus far. Pt has declined colonoscopy so far. Prostate ca screening 2022. An After Visit Summary was printed and given to the patient.  Return in about 6 months (around 02/05/2021) for routine chronic illness f/u.  Signed:  04/07/2021, MD           08/07/2020

## 2020-08-14 ENCOUNTER — Other Ambulatory Visit: Payer: Self-pay

## 2020-08-14 DIAGNOSIS — I10 Essential (primary) hypertension: Secondary | ICD-10-CM

## 2020-08-14 DIAGNOSIS — E785 Hyperlipidemia, unspecified: Secondary | ICD-10-CM

## 2020-08-14 DIAGNOSIS — F419 Anxiety disorder, unspecified: Secondary | ICD-10-CM

## 2020-08-14 DIAGNOSIS — E119 Type 2 diabetes mellitus without complications: Secondary | ICD-10-CM

## 2020-08-14 MED ORDER — LISINOPRIL 10 MG PO TABS: 10.0000 mg | ORAL_TABLET | Freq: Every day | ORAL | 1 refills | Status: DC

## 2020-08-14 MED ORDER — SIMVASTATIN 40 MG PO TABS: 40.0000 mg | ORAL_TABLET | Freq: Every evening | ORAL | 1 refills | Status: DC

## 2020-08-14 MED ORDER — METFORMIN HCL 1000 MG PO TABS: 1000.0000 mg | ORAL_TABLET | Freq: Two times a day (BID) | ORAL | 1 refills | Status: DC

## 2020-08-14 MED ORDER — GLIPIZIDE ER 5 MG PO TB24: 5.0000 mg | ORAL_TABLET | Freq: Every day | ORAL | 1 refills | Status: DC

## 2020-08-14 MED ORDER — CITALOPRAM HYDROBROMIDE 20 MG PO TABS: 20.0000 mg | ORAL_TABLET | Freq: Every day | ORAL | 1 refills | Status: DC

## 2021-02-07 ENCOUNTER — Other Ambulatory Visit: Payer: Self-pay

## 2021-02-07 ENCOUNTER — Other Ambulatory Visit: Payer: Self-pay | Admitting: Family Medicine

## 2021-02-07 DIAGNOSIS — E119 Type 2 diabetes mellitus without complications: Secondary | ICD-10-CM

## 2021-02-07 MED ORDER — GLIPIZIDE ER 5 MG PO TB24: 5.0000 mg | ORAL_TABLET | Freq: Every day | ORAL | 0 refills | Status: DC

## 2021-02-07 MED ORDER — METFORMIN HCL 1000 MG PO TABS: 1000.0000 mg | ORAL_TABLET | Freq: Two times a day (BID) | ORAL | 0 refills | Status: DC

## 2021-02-12 ENCOUNTER — Telehealth: Payer: Self-pay

## 2021-02-12 NOTE — Telephone Encounter (Signed)
LVM for pt to call back. 30 d/s sent to local pharmacy; pt needs to schedule f/u appt for further refill. Does pt still want Rx sent to [facility]?    [facility] Day - Client TELEPHONE ADVICE RECORD AccessNurse Patient Name: RAYDER SULLENGER Gender: Male DOB: [birth year] Age: 50 Y 2 M 19 D Return Phone Number: [phone] (Primary) Address: City/ State/ Zip: [place]  [address] Client [facility] Day - Client Client Site [facility] - Day Physician Santiago Bumpers - MD Contact Type Call Who Is Calling Patient / Member / Family / Caregiver Call Type Triage / Clinical Relationship To Patient Self Return Phone Number [phone] (Primary) Chief Complaint Prescription Refill or Medication Request (non symptomatic) Reason for Call Medication Question / Request Initial Comment Caller states he had prescriptions called in to [facility] but he needed those sent to [facility]. The phone number for the pharmacy is [phone] Translation No Nurse Assessment Nurse: Wyn Quaker, RN, Marylene Land Date/Time (Eastern Time): 02/08/2021 1:08:11 PM Confirm and document reason for call. If symptomatic, describe symptoms. ---Caller stated that he needs his Rx sent to [facility] and not to [facility]. He stated that he has a couple weeks left. No s/s currently. Does the patient have any new or worsening symptoms? ---No Guidelines Guideline Title Affirmed Question Affirmed Notes Nurse Date/Time (Eastern Time) Medication Question Call [1] Prescription refill request for nonessential med (no harm to patient if med not taken) AND [2] triager unable to fill per unit policy Dew, RN, Marylene Land 02/08/2021 1:09:15 PM Disp. Time Lamount Cohen Time) Disposition Final User 02/08/2021 1:10:56 PM Call PCP when Office is Open Yes Dew, RN, Rosalyn Charters Disagree/Comply Comply Caller Understands Yes PLEASE NOTE: All timestamps contained within  this report are represented as [place] Standard Time. CONFIDENTIALTY NOTICE: This fax transmission is intended only for the addressee. It contains information that is legally privileged, confidential or otherwise protected from use or disclosure. If you are not the intended recipient, you are strictly prohibited from reviewing, disclosing, copying using or disseminating any of this information or taking any action in reliance on or regarding this information. If you have received this fax in error, please notify us immediately by telephone so that we can arrange for its return to [place]. Phone: [phone], Toll-Free: [phone], Fax: [phone] Page: 2 of 2 Call Id: [id number] PreDisposition Call Doctor Care Advice Given Per Guideline CALL PCP WHEN OFFICE IS OPEN: * You need to discuss this with your child's doctor (or NP/PA) within the next few days. * Call the office when it is open. CALL BACK IF: * You have other questions or concerns * Your child becomes worse CARE ADVICE given per Medication Question Call - No Triage (Pediatric) guideline. Referrals REFERRED TO PCP OFFICE

## 2021-03-01 ENCOUNTER — Encounter: Payer: Self-pay | Admitting: Family Medicine

## 2021-03-01 ENCOUNTER — Ambulatory Visit (INDEPENDENT_AMBULATORY_CARE_PROVIDER_SITE_OTHER): Payer: Medicare HMO | Admitting: Family Medicine

## 2021-03-01 ENCOUNTER — Other Ambulatory Visit: Payer: Self-pay

## 2021-03-01 VITALS — BP 107/72 | HR 94 | Temp 98.0°F | Resp 16 | Ht 78.0 in | Wt 329.8 lb

## 2021-03-01 DIAGNOSIS — I10 Essential (primary) hypertension: Secondary | ICD-10-CM

## 2021-03-01 DIAGNOSIS — E7800 Pure hypercholesterolemia, unspecified: Secondary | ICD-10-CM | POA: Diagnosis not present

## 2021-03-01 DIAGNOSIS — E119 Type 2 diabetes mellitus without complications: Secondary | ICD-10-CM | POA: Diagnosis not present

## 2021-03-01 MED ORDER — METFORMIN HCL 1000 MG PO TABS: 1000.0000 mg | ORAL_TABLET | Freq: Two times a day (BID) | ORAL | 3 refills | Status: DC

## 2021-03-01 MED ORDER — GLIPIZIDE ER 5 MG PO TB24
5.0000 mg | ORAL_TABLET | Freq: Every day | ORAL | 3 refills | Status: DC
Start: 2021-03-01 — End: 2021-11-29

## 2021-03-01 NOTE — Addendum Note (Signed)
Addended by: Paschal Dopp on: 03/01/2021 03:56 PM   Modules accepted: Orders

## 2021-03-01 NOTE — Progress Notes (Signed)
OFFICE VISIT  03/01/2021  CC:  Chief Complaint  Patient presents with  . Follow-up    RCI; HTN, DM 2. He is fasting     HPI:    Patient is a 50 y.o. Caucasian male who presents for 6 mo f/u HTN, HLD, DM 2. A/P as of last visit: "1) DM: w/out comp. He has eye appt scheduled. Feet exam UTD. A1c, BMET today. Continue glipizide and metformin.  2) HTN: well controlled. Continue lisinopril 10 mg qd. BMET today.  3) HLD: tolerating statin (simva 40mg --continue this). Diet improving. Last lipid panel 4 mo ago showed LDL 68, trigs just a tad high. Hepatic panel normal at that time. Plan repeat FLP and hepatic panel 6 mo.  4) Preventative health: Vaccines; Tdap UTD.  Last pneumovax was 5 yrs ago, repeat recommended->he'll put off for now since getting flu vaccine today.  Flu->given today.  Covid 19->he has declined thus far. Pt has declined colonoscopy so far. Prostate ca screening 2022."  INTERIM HX: Doing well. Not eating healthy, doing a lot of convenience-type eating. 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  He did completely quit sodas.  Has started walking some for exercise, some yard work---moved into new home in [place] recently.    No home gluc monitoring. Rare home bp check : 130/80 typically. Last ate 4 hours ago: peanut butter crackers.     ROS as above, plus--> no fevers, no CP, no SOB, no wheezing, no cough, no dizziness, no HAs, no rashes, no melena/hematochezia.  No polyuria or polydipsia.  No myalgias or arthralgias.  No focal weakness, paresthesias, or tremors.  No acute vision or hearing abnormalities.  No dysuria or unusual/new urinary urgency or frequency.  No recent changes in lower legs. No n/v/d or abd pain.  No palpitations.     Past Medical History:  Diagnosis Date  . Anxiety and depression   . Asthma   . DVT (deep venous thrombosis) (HCC)    in context of surgery  . Essential hypertension   . Gout   . History of blood  transfusion   . Hyperlipidemia   . OSA (obstructive sleep apnea)    on CPAP  . Osteoarthritis   . Pulmonary embolism (HCC)    in context of surgery  . Rheumatoid arthritis (HCC)    rheum not completely sure; possible simply osteoarth.  Was on DMARD at one point time.  . Type 2 diabetes mellitus without complication (HCC)   . Ureterolithiasis    L and R    Past Surgical History:  Procedure Laterality Date  . EXTRACORPOREAL SHOCK WAVE LITHOTRIPSY Left 06/03/2018   Procedure: EXTRACORPOREAL SHOCK WAVE LITHOTRIPSY (ESWL);  Surgeon: 08/03/2018, MD;  Location: [facility] ORS;  Service: Urology;  Laterality: Left;  . KNEE ARTHROSCOPY Right   . KNEE SURGERY Left     Outpatient Medications Prior to Visit  Medication Sig Dispense Refill  . aspirin 81 MG tablet Take 81 mg by mouth daily.     . citalopram (CELEXA) 20 MG tablet Take 1 tablet (20 mg total) by mouth daily. 90 tablet 1  . Glucosamine 500 MG CAPS Take 1,000 mg by mouth daily.     Vanna Scotland lisinopril (ZESTRIL) 10 MG tablet Take 1 tablet (10 mg total) by mouth daily. 90 tablet 1  . loratadine (CLARITIN) 10 MG tablet Take 10 mg by mouth daily as needed for allergies.    . Multiple Vitamin (MULTIVITAMIN) capsule Take 1 capsule by mouth daily.     Marland Kitchen  simvastatin (ZOCOR) 40 MG tablet Take 1 tablet (40 mg total) by mouth every evening. for cholesterol 90 tablet 1  . glipiZIDE (GLUCOTROL XL) 5 MG 24 hr tablet Take 1 tablet (5 mg total) by mouth daily with breakfast. For diabetes. 30 tablet 0  . metFORMIN (GLUCOPHAGE) 1000 MG tablet Take 1 tablet (1,000 mg total) by mouth 2 (two) times daily with a meal. 60 tablet 0  . sildenafil (REVATIO) 20 MG tablet Take 2-5 tablets by mouth 30 minutes prior to intercourse. (Patient not taking: No sig reported) 50 tablet 0  . VENTOLIN HFA 108 (90 Base) MCG/ACT inhaler INHALE 2 PUFFS INTO THE LUNGS EVERY 4 (FOUR) HOURS AS NEEDED FOR WHEEZING OR SHORTNESS OF BREATH. (Patient not taking: No sig reported) 18 g 0   No  facility-administered medications prior to visit.    Allergies  Allergen Reactions  . Oxycodone-Acetaminophen Nausea Only    ROS As per HPI  PE: Vitals with BMI 03/01/2021 08/07/2020 04/16/2020  Height 6\' 6"  6\' 6"  6\' 6"   Weight 329 lbs 13 oz 320 lbs 10 oz 332 lbs  BMI 38.12 37.06 38.37  Systolic 107 115  Diastolic 72 77 82  Pulse 94 78 77     Gen: Alert, well appearing.  Patient is oriented to person, place, time, and situation. AFFECT: pleasant, lucid thought and speech. CV: RRR, no m/r/g.   LUNGS: CTA bilat, nonlabored resps, good aeration in all lung fields. EXT: no clubbing or cyanosis.  Trace bilat LL pitting edema.    LABS:  No results found for: TSH Lab Results  Component Value Date   WBC 10.9 (H) 12/18/2019   HGB 14.1 12/18/2019   HCT 41.3 12/18/2019   MCV 87.5 12/18/2019   PLT 249 12/18/2019   Lab Results  Component Value Date   CREATININE 1.11 08/07/2020   BUN 11 08/07/2020   NA 140 08/07/2020   K 4.2 08/07/2020   CL 103 08/07/2020   CO2 29 08/07/2020   Lab Results  Component Value Date   ALT 34 04/10/2020   AST 25 04/10/2020   ALKPHOS 50 04/10/2020   BILITOT 0.5 04/10/2020   Lab Results  Component Value Date   CHOL 144 04/10/2020   Lab Results  Component Value Date   HDL 37.50 (L) 04/10/2020   Lab Results  Component Value Date   LDLCALC 68 04/10/2020   Lab Results  Component Value Date   TRIG 189.0 (H) 04/10/2020   Lab Results  Component Value Date   CHOLHDL 4 04/10/2020   Lab Results  Component Value Date   HGBA1C 7.0 (H) 08/07/2020   IMPRESSION AND PLAN:  1) DM 2; no home monitoring.  Diet poor but compliant with metformin and glipizide. Hba1c and urine microalb/cr ordered.  2) HTN: well controlled on lisin 10 qd. Lytes/cr ordered.  3) HLD: tolerating simvastatin 40mg  qd longterm. LDL 68 about 1 yr ago. FLP and hepatic panel ordered.  An After Visit Summary was printed and given to the patient.  FOLLOW UP:  Return in about 6 months (around 09/01/2021) for annual CPE (fasting) + RCI.  Signed:  04/12/2020, MD           03/01/2021

## 2021-03-02 LAB — COMPREHENSIVE METABOLIC PANEL
AG Ratio: 1.6 (calc) (ref 1.0–2.5)
ALT: 43 U/L (ref 9–46)
AST: 30 U/L (ref 10–35)
Albumin: 4.3 g/dL (ref 3.6–5.1)
Alkaline phosphatase (APISO): 54 U/L (ref 35–144)
BUN: 11 mg/dL (ref 7–25)
CO2: 25 mmol/L (ref 20–32)
Calcium: 9.6 mg/dL (ref 8.6–10.3)
Chloride: 104 mmol/L (ref 98–110)
Creat: 1.25 mg/dL (ref 0.70–1.33)
Globulin: 2.7 g/dL (calc) (ref 1.9–3.7)
Glucose, Bld: 127 mg/dL — ABNORMAL HIGH (ref 65–99)
Potassium: 4 mmol/L (ref 3.5–5.3)
Sodium: 140 mmol/L (ref 135–146)
Total Bilirubin: 0.5 mg/dL (ref 0.2–1.2)
Total Protein: 7 g/dL (ref 6.1–8.1)

## 2021-03-02 LAB — HEMOGLOBIN A1C
Hgb A1c MFr Bld: 7.4 % of total Hgb — ABNORMAL HIGH (ref <5.7)
Mean Plasma Glucose: 166 mg/dL
eAG (mmol/L): 9.2 mmol/L

## 2021-03-02 LAB — LIPID PANEL
Cholesterol: 147 mg/dL (ref <200)
HDL: 39 mg/dL — ABNORMAL LOW (ref >=40)
LDL Cholesterol (Calc): 76 mg/dL (calc)
Non-HDL Cholesterol (Calc): 108 mg/dL (calc) (ref <130)
Total CHOL/HDL Ratio: 3.8 (calc) (ref <5.0)
Triglycerides: 228 mg/dL — ABNORMAL HIGH (ref <150)

## 2021-03-02 LAB — MICROALBUMIN / CREATININE URINE RATIO
Creatinine, Urine: 156 mg/dL (ref 20–320)
Microalb Creat Ratio: 4 mcg/mg creat (ref <30)
Microalb, Ur: 0.6 mg/dL

## 2021-05-13 ENCOUNTER — Other Ambulatory Visit: Payer: Self-pay | Admitting: Family Medicine

## 2021-05-13 DIAGNOSIS — E785 Hyperlipidemia, unspecified: Secondary | ICD-10-CM

## 2021-05-13 DIAGNOSIS — I10 Essential (primary) hypertension: Secondary | ICD-10-CM

## 2021-05-13 DIAGNOSIS — F32A Depression, unspecified: Secondary | ICD-10-CM

## 2021-05-13 DIAGNOSIS — F419 Anxiety disorder, unspecified: Secondary | ICD-10-CM

## 2021-07-03 ENCOUNTER — Ambulatory Visit: Payer: Medicare HMO

## 2021-07-04 ENCOUNTER — Ambulatory Visit (INDEPENDENT_AMBULATORY_CARE_PROVIDER_SITE_OTHER): Payer: Medicare HMO | Admitting: *Deleted

## 2021-07-04 DIAGNOSIS — Z0000 Encounter for general adult medical examination without abnormal findings: Secondary | ICD-10-CM | POA: Diagnosis not present

## 2021-07-04 DIAGNOSIS — Z1211 Encounter for screening for malignant neoplasm of colon: Secondary | ICD-10-CM | POA: Diagnosis not present

## 2021-07-04 NOTE — Patient Instructions (Signed)
Nathan Krause , Thank you for taking time to come for your Medicare Wellness Visit. I appreciate your ongoing commitment to your health goals. Please review the following plan we discussed and let me know if I can assist you in the future.   Screening recommendations/referrals: Colonoscopy: Education provided Recommended yearly ophthalmology/optometry visit for glaucoma screening and checkup Recommended yearly dental visit for hygiene and checkup  Vaccinations: Influenza vaccine: up to date Pneumococcal vaccine: up to date Tdap vaccine: up to date Shingles vaccine: Education provided    Advanced directives: copy requested  Conditions/risks identified:   Next appointment: 09-04-2021 @ 9:00  Dr. Milinda Cave  Preventive Care 20 Years and Older, Male Preventive care refers to lifestyle choices and visits with your health care provider that can promote health and wellness. What does preventive care include? A yearly physical exam. This is also called an annual well check. Dental exams once or twice a year. Routine eye exams. Ask your health care provider how often you should have your eyes checked. Personal lifestyle choices, including: Daily care of your teeth and gums. Regular physical activity. Eating a healthy diet. Avoiding tobacco and drug use. Limiting alcohol use. Practicing safe sex. Taking low doses of aspirin every day. Taking vitamin and mineral supplements as recommended by your health care provider. What happens during an annual well check? The services and screenings done by your health care provider during your annual well check will depend on your age, overall health, lifestyle risk factors, and family history of disease. Counseling  Your health care provider may ask you questions about your: Alcohol use. Tobacco use. Drug use. Emotional well-being. Home and relationship well-being. Sexual activity. Eating habits. History of falls. Memory and ability to understand  (cognition). Work and work Astronomer. Screening  You may have the following tests or measurements: Height, weight, and BMI. Blood pressure. Lipid and cholesterol levels. These may be checked every 5 years, or more frequently if you are over 60 years old. Skin check. Lung cancer screening. You may have this screening every year starting at age 72 if you have a 30-pack-year history of smoking and currently smoke or have quit within the past 15 years. Fecal occult blood test (FOBT) of the stool. You may have this test every year starting at age 79. Flexible sigmoidoscopy or colonoscopy. You may have a sigmoidoscopy every 5 years or a colonoscopy every 10 years starting at age 66. Prostate cancer screening. Recommendations will vary depending on your family history and other risks. Hepatitis C blood test. Hepatitis B blood test. Sexually transmitted disease (STD) testing. Diabetes screening. This is done by checking your blood sugar (glucose) after you have not eaten for a while (fasting). You may have this done every 1-3 years. Abdominal aortic aneurysm (AAA) screening. You may need this if you are a current or former smoker. Osteoporosis. You may be screened starting at age 28 if you are at high risk. Talk with your health care provider about your test results, treatment options, and if necessary, the need for more tests. Vaccines  Your health care provider may recommend certain vaccines, such as: Influenza vaccine. This is recommended every year. Tetanus, diphtheria, and acellular pertussis (Tdap, Td) vaccine. You may need a Td booster every 10 years. Zoster vaccine. You may need this after age 75. Pneumococcal 13-valent conjugate (PCV13) vaccine. One dose is recommended after age 44. Pneumococcal polysaccharide (PPSV23) vaccine. One dose is recommended after age 64. Talk to your health care provider about which screenings and  vaccines you need and how often you need them. This  information is not intended to replace advice given to you by your health care provider. Make sure you discuss any questions you have with your health care provider. Document Released: 11/09/2015 Document Revised: 07/02/2016 Document Reviewed: 08/14/2015 Elsevier Interactive Patient Education  2017 [employer] Prevention in the Home Falls can cause injuries. They can happen to people of all ages. There are many things you can do to make your home safe and to help prevent falls. What can I do on the outside of my home? Regularly fix the edges of walkways and driveways and fix any cracks. Remove anything that might make you trip as you walk through a door, such as a raised step or threshold. Trim any bushes or trees on the path to your home. Use bright outdoor lighting. Clear any walking paths of anything that might make someone trip, such as rocks or tools. Regularly check to see if handrails are loose or broken. Make sure that both sides of any steps have handrails. Any raised decks and porches should have guardrails on the edges. Have any leaves, snow, or ice cleared regularly. Use sand or salt on walking paths during winter. Clean up any spills in your garage right away. This includes oil or grease spills. What can I do in the bathroom? Use night lights. Install grab bars by the toilet and in the tub and shower. Do not use towel bars as grab bars. Use non-skid mats or decals in the tub or shower. If you need to sit down in the shower, use a plastic, non-slip stool. Keep the floor dry. Clean up any water that spills on the floor as soon as it happens. Remove soap buildup in the tub or shower regularly. Attach bath mats securely with double-sided non-slip rug tape. Do not have throw rugs and other things on the floor that can make you trip. What can I do in the bedroom? Use night lights. Make sure that you have a light by your bed that is easy to reach. Do not use any sheets or  blankets that are too big for your bed. They should not hang down onto the floor. Have a firm chair that has side arms. You can use this for support while you get dressed. Do not have throw rugs and other things on the floor that can make you trip. What can I do in the kitchen? Clean up any spills right away. Avoid walking on wet floors. Keep items that you use a lot in easy-to-reach places. If you need to reach something above you, use a strong step stool that has a grab bar. Keep electrical cords out of the way. Do not use floor polish or wax that makes floors slippery. If you must use wax, use non-skid floor wax. Do not have throw rugs and other things on the floor that can make you trip. What can I do with my stairs? Do not leave any items on the stairs. Make sure that there are handrails on both sides of the stairs and use them. Fix handrails that are broken or loose. Make sure that handrails are as long as the stairways. Check any carpeting to make sure that it is firmly attached to the stairs. Fix any carpet that is loose or worn. Avoid having throw rugs at the top or bottom of the stairs. If you do have throw rugs, attach them to the floor with carpet tape. Make  sure that you have a light switch at the top of the stairs and the bottom of the stairs. If you do not have them, ask someone to add them for you. What else can I do to help prevent falls? Wear shoes that: Do not have high heels. Have rubber bottoms. Are comfortable and fit you well. Are closed at the toe. Do not wear sandals. If you use a stepladder: Make sure that it is fully opened. Do not climb a closed stepladder. Make sure that both sides of the stepladder are locked into place. Ask someone to hold it for you, if possible. Clearly mark and make sure that you can see: Any grab bars or handrails. First and last steps. Where the edge of each step is. Use tools that help you move around (mobility aids) if they are  needed. These include: Canes. Walkers. Scooters. Crutches. Turn on the lights when you go into a dark area. Replace any light bulbs as soon as they burn out. Set up your furniture so you have a clear path. Avoid moving your furniture around. If any of your floors are uneven, fix them. If there are any pets around you, be aware of where they are. Review your medicines with your doctor. Some medicines can make you feel dizzy. This can increase your chance of falling. Ask your doctor what other things that you can do to help prevent falls. This information is not intended to replace advice given to you by your health care provider. Make sure you discuss any questions you have with your health care provider. Document Released: 08/09/2009 Document Revised: 03/20/2016 Document Reviewed: 11/17/2014 Elsevier Interactive Patient Education  2017 [employer].

## 2021-07-04 NOTE — Progress Notes (Signed)
Subjective:   Nathan Krause is a 50 y.o. male who presents for Medicare Annual/Subsequent preventive examination.  I connected with  Vito Backers on 07/04/21 by a telephone enabled telemedicine application and verified that I am speaking with the correct person using two identifiers.   I discussed the limitations of evaluation and management by telemedicine. The patient expressed understanding and agreed to proceed.    Review of Systems     Cardiac Risk Factors include: advanced age (>83men, >68 women);diabetes mellitus     Objective:    Today's Vitals   There is no height or weight on file to calculate BMI.  Advanced Directives 07/04/2021 12/18/2019 05/25/2018  Does Patient Have a Medical Advance Directive? Yes No No  Type of Estate agent of [place];Living will - -  Copy of Healthcare Power of Attorney in Chart? No - copy requested - -  Would patient like information on creating a medical advance directive? - - No - Patient declined    Current Medications (verified) Outpatient Encounter Medications as of 07/04/2021  Medication Sig   aspirin 81 MG tablet Take 81 mg by mouth daily.    citalopram (CELEXA) 20 MG tablet TAKE 1 TABLET (20 MG TOTAL) BY MOUTH DAILY.   glipiZIDE (GLUCOTROL XL) 5 MG 24 hr tablet Take 1 tablet (5 mg total) by mouth daily with breakfast. For diabetes.   Glucosamine 500 MG CAPS Take 1,000 mg by mouth daily.    lisinopril (ZESTRIL) 10 MG tablet TAKE 1 TABLET (10 MG TOTAL) BY MOUTH DAILY.   loratadine (CLARITIN) 10 MG tablet Take 10 mg by mouth daily as needed for allergies.   metFORMIN (GLUCOPHAGE) 1000 MG tablet Take 1 tablet (1,000 mg total) by mouth 2 (two) times daily with a meal.   Multiple Vitamin (MULTIVITAMIN) capsule Take 1 capsule by mouth daily.    simvastatin (ZOCOR) 40 MG tablet TAKE 1 TABLET (40 MG TOTAL) BY MOUTH EVERY EVENING. FOR CHOLESTEROL   sildenafil (REVATIO) 20 MG tablet Take 2-5 tablets by mouth 30 minutes prior  to intercourse. (Patient not taking: No sig reported)   VENTOLIN HFA 108 (90 Base) MCG/ACT inhaler INHALE 2 PUFFS INTO THE LUNGS EVERY 4 (FOUR) HOURS AS NEEDED FOR WHEEZING OR SHORTNESS OF BREATH. (Patient not taking: No sig reported)   No facility-administered encounter medications on file as of 07/04/2021.    Allergies (verified) Oxycodone-acetaminophen   History: Past Medical History:  Diagnosis Date   Anxiety and depression    Asthma    DVT (deep venous thrombosis) (HCC)    in context of surgery   Essential hypertension    Gout    History of blood transfusion    Hyperlipidemia    OSA (obstructive sleep apnea)    on CPAP   Osteoarthritis    Pulmonary embolism (HCC)    in context of surgery   Rheumatoid arthritis (HCC)    rheum not completely sure; possible simply osteoarth.  Was on DMARD at one point time.   Type 2 diabetes mellitus without complication (HCC)    Ureterolithiasis    L and R   Past Surgical History:  Procedure Laterality Date   EXTRACORPOREAL SHOCK WAVE LITHOTRIPSY Left 06/03/2018   Procedure: EXTRACORPOREAL SHOCK WAVE LITHOTRIPSY (ESWL);  Surgeon: Vanna Scotland, MD;  Location: [facility] ORS;  Service: Urology;  Laterality: Left;   KNEE ARTHROSCOPY Right    KNEE SURGERY Left    Family History  Problem Relation Age of Onset   Heart  disease Father    Heart attack Father 68       deceased   Colon cancer Mother 55   Breast cancer Mother    Diabetes Paternal Grandmother    Social History   Socioeconomic History   Marital status: Married    Spouse name: Not on file   Number of children: Not on file   Years of education: Not on file   Highest education level: Not on file  Occupational History   Occupation: Disabled  Tobacco Use   Smoking status: Never   Smokeless tobacco: Current    Types: Snuff  Vaping Use   Vaping Use: Never used  Substance and Sexual Activity   Alcohol use: Yes    Alcohol/week: 1.0 standard drink    Types: 1 Cans of beer per  week    Comment: social   Drug use: Never   Sexual activity: Yes    Birth control/protection: None  Other Topics Concern   Not on file  Social History Narrative   Married.   2 children.   Educ: BA in information systems and cybersecurity.   Occup: Warden/ranger. Disabled for a while due to knee/mobility issues--returned to work 02/2020.   No tob.   Alc: occ beer.   Enjoys watching movies, working out, spending time with dogs, traveling.   Social Determinants of Health   Financial Resource Strain: Low Risk    Difficulty of Paying Living Expenses: Not hard at all  Food Insecurity: No Food Insecurity   Worried About Programme researcher, broadcasting/film/video in the Last Year: Never true   Ran Out of Food in the Last Year: Never true  Transportation Needs: No Transportation Needs   Lack of Transportation (Medical): No   Lack of Transportation (Non-Medical): No  Physical Activity: Sufficiently Active   Days of Exercise per Week: 4 days   Minutes of Exercise per Session: 40 min  Stress: No Stress Concern Present   Feeling of Stress : Not at all  Social Connections: Moderately Integrated   Frequency of Communication with Friends and Family: More than three times a week   Frequency of Social Gatherings with Friends and Family: More than three times a week   Attends Religious Services: Never   Database administrator or Organizations: Yes   Attends Engineer, structural: More than 4 times per year   Marital Status: Married    Tobacco Counseling Ready to quit: Not Answered Counseling given: Not Answered   Clinical Intake:  Pre-visit preparation completed: Yes  Pain : No/denies pain     Nutritional Risks: None Diabetes: Yes CBG done?: No Did pt. bring in CBG monitor from home?: No  How often do you need to have someone help you when you read instructions, pamphlets, or other written materials from your doctor or pharmacy?: 1 - Never  Diabetic? Yes  Nutrition Risk  Assessment:  Has the patient had any N/V/D within the last 2 months?  No  Does the patient have any non-healing wounds?  No  Has the patient had any unintentional weight loss or weight gain?  No   Diabetes:  Is the patient diabetic?  Yes  If diabetic, was a CBG obtained today?  No  Did the patient bring in their glucometer from home?  No  How often do you monitor your CBG's? Does not check.   Financial Strains and Diabetes Management:  Are you having any financial strains with the device, your supplies or your medication?  No .  Does the patient want to be seen by Chronic Care Management for management of their diabetes?  No  Would the patient like to be referred to a Nutritionist or for Diabetic Management?  No   Diabetic Exams:  Diabetic Eye Exam: Completed . Pt has been advised about the importance in completing this exam. Diabetic Foot Exam: Completed   Interpreter Needed?: No  Information entered by :: Remi HaggardJulie Bejamin Hackbart LPN   Activities of Daily Living In your present state of health, do you have any difficulty performing the following activities: 07/04/2021  Hearing? Y  Vision? N  Difficulty concentrating or making decisions? N  Walking or climbing stairs? N  Dressing or bathing? N  Doing errands, shopping? N  Preparing Food and eating ? N  Using the Toilet? N  In the past six months, have you accidently leaked urine? N  Do you have problems with loss of bowel control? N  Managing your Medications? N  Managing your Finances? N  Housekeeping or managing your Housekeeping? N  Some recent data might be hidden    Patient Care Team: Jeoffrey MassedMcGowen, Philip H, MD as PCP - General (Family Medicine)  Indicate any recent Medical Services you may have received from other than [facility] providers in the past year (date may be approximate).     Assessment:   This is a routine wellness examination for Arlys JohnBrian.  Hearing/Vision screen Hearing Screening - Comments:: Hearing loss Right  ear Vision Screening - Comments:: Up to date [facility] [place]  Dietary issues and exercise activities discussed: Current Exercise Habits: Home exercise routine;Structured exercise class, Type of exercise: walking;strength training/weights (volleyball), Time (Minutes): 35, Frequency (Times/Week): 4, Weekly Exercise (Minutes/Week): 140, Intensity: Moderate   Goals Addressed             This Visit's Progress    Patient Stated       Loose weight 270-280       Depression Screen PHQ 2/9 Scores 07/04/2021 03/01/2021 08/07/2020 03/24/2017  PHQ - 2 Score 0 0 0 0  PHQ- 9 Score - 2 - -    Fall Risk Fall Risk  07/04/2021  Falls in the past year? 1  Number falls in past yr: 0  Injury with Fall? 0  Follow up Falls evaluation completed;Falls prevention discussed;Education provided    FALL RISK PREVENTION PERTAINING TO THE HOME:  Any stairs in or around the home? Yes  If so, are there any without handrails? No  Home free of loose throw rugs in walkways, pet beds, electrical cords, etc? Yes  Adequate lighting in your home to reduce risk of falls? Yes   ASSISTIVE DEVICES UTILIZED TO PREVENT FALLS:  Life alert? No  Use of a cane, walker or w/c? No  Grab bars in the bathroom? Yes  Shower chair or bench in shower? No  Elevated toilet seat or a handicapped toilet? Yes   TIMED UP AND GO:  Was the test performed? No .    Cognitive Function:  Normal cognitive status assessed by direct observation by this Nurse Health Advisor. No abnormalities found.        Immunizations Immunization History  Administered Date(s) Administered   Influenza Split 07/28/2011, 08/16/2012   Influenza, Seasonal, Injecte, Preservative Fre 09/26/2014   Influenza,inj,Quad PF,6+ Mos 09/19/2015, 08/11/2019, 08/07/2020   Influenza-Unspecified 08/08/2013   Meningococcal Polysaccharide 08/17/2011   Pneumococcal Polysaccharide-23 08/10/2015   Td 03/24/2017   Tdap 03/28/2007    TDAP status: Up to  date  Flu Vaccine status: Up to date  Pneumococcal vaccine status: Up to date  Covid-19 vaccine status: Declined, Education has been provided regarding the importance of this vaccine but patient still declined. Advised may receive this vaccine at local pharmacy or Health Dept.or vaccine clinic. Aware to provide a copy of the vaccination record if obtained from local pharmacy or Health Dept. Verbalized acceptance and understanding.  Qualifies for Shingles Vaccine? Yes   Zostavax completed No   Shingrix Completed?: No.    Education has been provided regarding the importance of this vaccine. Patient has been advised to call insurance company to determine out of pocket expense if they have not yet received this vaccine. Advised may also receive vaccine at local pharmacy or Health Dept. Verbalized acceptance and understanding.  Screening Tests Health Maintenance  Topic Date Due   COVID-19 Vaccine (1) Never done   HIV Screening  Never done   Hepatitis C Screening  Never done   COLONOSCOPY (Pts 45-37yrs Insurance coverage will need to be confirmed)  Never done   OPHTHALMOLOGY EXAM  02/05/2018   Zoster Vaccines- Shingrix (1 of 2) Never done   FOOT EXAM  04/16/2021   INFLUENZA VACCINE  05/27/2021   HEMOGLOBIN A1C  09/01/2021   TETANUS/TDAP  03/25/2027   PNEUMOCOCCAL POLYSACCHARIDE VACCINE AGE 33-64 HIGH RISK  Completed   Pneumococcal Vaccine 48-6 Years old  Aged Out   HPV VACCINES  Aged Out    Health Maintenance  Health Maintenance Due  Topic Date Due   COVID-19 Vaccine (1) Never done   HIV Screening  Never done   Hepatitis C Screening  Never done   COLONOSCOPY (Pts 45-54yrs Insurance coverage will need to be confirmed)  Never done   OPHTHALMOLOGY EXAM  02/05/2018   Zoster Vaccines- Shingrix (1 of 2) Never done   FOOT EXAM  04/16/2021   INFLUENZA VACCINE  05/27/2021    Colorectal cancer screening: Referral to GI placed  . Pt aware the office will call re: appt.  Lung Cancer  Screening: (Low Dose CT Chest recommended if Age 1-80 years, 30 pack-year currently smoking OR have quit w/in 15years.) does not qualify.   Lung Cancer Screening Referral:   Additional Screening:  Hepatitis C Screening: does not qualify; Completed   Vision Screening: Recommended annual ophthalmology exams for early detection of glaucoma and other disorders of the eye. Is the patient up to date with their annual eye exam?  Yes  Who is the provider or what is the name of the office in which the patient attends annual eye exams? [facility] If pt is not established with a provider, would they like to be referred to a provider to establish care? No .   Dental Screening: Recommended annual dental exams for proper oral hygiene  Community Resource Referral / Chronic Care Management: CRR required this visit?  No   CCM required this visit?  No      Plan:     I have personally reviewed and noted the following in the patient's chart:   Medical and social history Use of alcohol, tobacco or illicit drugs  Current medications and supplements including opioid prescriptions. Patient is not currently taking opioid prescriptions. Functional ability and status Nutritional status Physical activity Advanced directives List of other physicians Hospitalizations, surgeries, and ER visits in previous 12 months Vitals Screenings to include cognitive, depression, and falls Referrals and appointments  In addition, I have reviewed and discussed with patient certain preventive protocols, quality metrics, and best practice  recommendations. A written personalized care plan for preventive services as well as general preventive health recommendations were provided to patient.     Remi Haggard, LPN   03/03/997   Nurse Notes:

## 2021-09-04 ENCOUNTER — Encounter: Payer: Medicare HMO | Admitting: Family Medicine

## 2021-09-04 NOTE — Progress Notes (Deleted)
Office Note 09/04/2021  CC: No chief complaint on file.   HPI:  Patient is a 50 y.o. male who is here for annual health maintenance exam and 6 mo f/u HTN, HLD, DM 2. A/P as of last visit: "1) DM 2; no home monitoring.  Diet poor but compliant with metformin and glipizide. Hba1c and urine microalb/cr ordered.   2) HTN: well controlled on lisin 10 qd. Lytes/cr ordered.   3) HLD: tolerating simvastatin 40mg  qd longterm. LDL 68 about 1 yr ago. FLP and hepatic panel ordered."  INTERIM HX: ***   Past Medical History:  Diagnosis Date   Anxiety and depression    Asthma    DVT (deep venous thrombosis) (HCC)    in context of surgery   Essential hypertension    Gout    History of blood transfusion    Hyperlipidemia    OSA (obstructive sleep apnea)    on CPAP   Osteoarthritis    Pulmonary embolism (HCC)    in context of surgery   Rheumatoid arthritis (HCC)    rheum not completely sure; possible simply osteoarth.  Was on DMARD at one point time.   Type 2 diabetes mellitus without complication (HCC)    Ureterolithiasis    L and R    Past Surgical History:  Procedure Laterality Date   EXTRACORPOREAL SHOCK WAVE LITHOTRIPSY Left 06/03/2018   Procedure: EXTRACORPOREAL SHOCK WAVE LITHOTRIPSY (ESWL);  Surgeon: 08/03/2018, MD;  Location: [facility] ORS;  Service: Urology;  Laterality: Left;   KNEE ARTHROSCOPY Right    KNEE SURGERY Left     Family History  Problem Relation Age of Onset   Heart disease Father    Heart attack Father 32       deceased   Colon cancer Mother 18   Breast cancer Mother    Diabetes Paternal Grandmother     Social History   Socioeconomic History   Marital status: Married    Spouse name: Not on file   Number of children: Not on file   Years of education: Not on file   Highest education level: Not on file  Occupational History   Occupation: Disabled  Tobacco Use   Smoking status: Never   Smokeless tobacco: Current    Types: Snuff  Vaping  Use   Vaping Use: Never used  Substance and Sexual Activity   Alcohol use: Yes    Alcohol/week: 1.0 standard drink    Types: 1 Cans of beer per week    Comment: social   Drug use: Never   Sexual activity: Yes    Birth control/protection: None  Other Topics Concern   Not on file  Social History Narrative   Married.   2 children.   Educ: BA in information systems and cybersecurity.   Occup: 71. Disabled for a while due to knee/mobility issues--returned to work 02/2020.   No tob.   Alc: occ beer.   Enjoys watching movies, working out, spending time with dogs, traveling.   Social Determinants of Health   Financial Resource Strain: Low Risk    Difficulty of Paying Living Expenses: Not hard at all  Food Insecurity: No Food Insecurity   Worried About 03/2020 in the Last Year: Never true   Ran Out of Food in the Last Year: Never true  Transportation Needs: No Transportation Needs   Lack of Transportation (Medical): No   Lack of Transportation (Non-Medical): No  Physical Activity: Sufficiently Active   Days of  Exercise per Week: 4 days   Minutes of Exercise per Session: 40 min  Stress: No Stress Concern Present   Feeling of Stress : Not at all  Social Connections: Moderately Integrated   Frequency of Communication with Friends and Family: More than three times a week   Frequency of Social Gatherings with Friends and Family: More than three times a week   Attends Religious Services: Never   Database administrator or Organizations: Yes   Attends Engineer, structural: More than 4 times per year   Marital Status: Married  Catering manager Violence: Not At Risk   Fear of Current or Ex-Partner: No   Emotionally Abused: No   Physically Abused: No   Sexually Abused: No    Outpatient Medications Prior to Visit  Medication Sig Dispense Refill   aspirin 81 MG tablet Take 81 mg by mouth daily.      citalopram (CELEXA) 20 MG tablet TAKE 1 TABLET  (20 MG TOTAL) BY MOUTH DAILY. 90 tablet 1   glipiZIDE (GLUCOTROL XL) 5 MG 24 hr tablet Take 1 tablet (5 mg total) by mouth daily with breakfast. For diabetes. 90 tablet 3   Glucosamine 500 MG CAPS Take 1,000 mg by mouth daily.      lisinopril (ZESTRIL) 10 MG tablet TAKE 1 TABLET (10 MG TOTAL) BY MOUTH DAILY. 90 tablet 1   loratadine (CLARITIN) 10 MG tablet Take 10 mg by mouth daily as needed for allergies.     metFORMIN (GLUCOPHAGE) 1000 MG tablet Take 1 tablet (1,000 mg total) by mouth 2 (two) times daily with a meal. 180 tablet 3   Multiple Vitamin (MULTIVITAMIN) capsule Take 1 capsule by mouth daily.      sildenafil (REVATIO) 20 MG tablet Take 2-5 tablets by mouth 30 minutes prior to intercourse. (Patient not taking: No sig reported) 50 tablet 0   simvastatin (ZOCOR) 40 MG tablet TAKE 1 TABLET (40 MG TOTAL) BY MOUTH EVERY EVENING. FOR CHOLESTEROL 90 tablet 1   VENTOLIN HFA 108 (90 Base) MCG/ACT inhaler INHALE 2 PUFFS INTO THE LUNGS EVERY 4 (FOUR) HOURS AS NEEDED FOR WHEEZING OR SHORTNESS OF BREATH. (Patient not taking: No sig reported) 18 g 0   No facility-administered medications prior to visit.    Allergies  Allergen Reactions   Oxycodone-Acetaminophen Nausea Only    ROS *** PE; Vitals with BMI 07/04/2021 03/01/2021 08/07/2020  Height (No Data) 6\' 6"  6\' 6"   Weight (No Data) 329 lbs 13 oz 320 lbs 10 oz  BMI - 38.12 37.06  Systolic (No Data) 107 115  Diastolic (No Data) 72 77  Pulse - 94 78     *** Pertinent labs:  No results found for: TSH Lab Results  Component Value Date   WBC 10.9 (H) 12/18/2019   HGB 14.1 12/18/2019   HCT 41.3 12/18/2019   MCV 87.5 12/18/2019   PLT 249 12/18/2019   Lab Results  Component Value Date   CREATININE 1.25 03/01/2021   BUN 11 03/01/2021   NA 140 03/01/2021   K 4.0 03/01/2021   CL 104 03/01/2021   CO2 25 03/01/2021   Lab Results  Component Value Date   ALT 43 03/01/2021   AST 30 03/01/2021   ALKPHOS 50 04/10/2020   BILITOT 0.5  03/01/2021   Lab Results  Component Value Date   CHOL 147 03/01/2021   Lab Results  Component Value Date   HDL 39 (L) 03/01/2021   Lab Results  Component Value Date  LDLCALC 76 03/01/2021   Lab Results  Component Value Date   TRIG 228 (H) 03/01/2021   Lab Results  Component Value Date   CHOLHDL 3.8 03/01/2021   Lab Results  Component Value Date   HGBA1C 7.4 (H) 03/01/2021   ASSESSMENT AND PLAN:   1) DM 2: Hba1c   2) HTN: Lytes/cr today.  3) HLD:  FLP and hepatic panel today.  4) Health maintenance exam: Reviewed age and gender appropriate health maintenance issues (prudent diet, regular exercise, health risks of tobacco and excessive alcohol, use of seatbelts, fire alarms in home, use of sunscreen).  Also reviewed age and gender appropriate health screening as well as vaccine recommendations. Vaccines: Prevnar 20->***.  Shingrix->***.  Flu->***. Labs: fasting HP, Hba1c, PSA. Prostate ca screening: PSA today. Colon ca screening: he has not had initial screening yet-->***.   An After Visit Summary was printed and given to the patient.  FOLLOW UP:  No follow-ups on file.  Signed:  Santiago Bumpers, MD           09/04/2021

## 2021-11-29 ENCOUNTER — Other Ambulatory Visit: Payer: Self-pay

## 2021-11-29 ENCOUNTER — Encounter: Payer: Self-pay | Admitting: Family Medicine

## 2021-11-29 ENCOUNTER — Ambulatory Visit (INDEPENDENT_AMBULATORY_CARE_PROVIDER_SITE_OTHER): Payer: Medicare HMO | Admitting: Family Medicine

## 2021-11-29 VITALS — BP 105/69 | HR 81 | Temp 98.8°F | Ht 78.0 in | Wt 324.8 lb

## 2021-11-29 DIAGNOSIS — E7800 Pure hypercholesterolemia, unspecified: Secondary | ICD-10-CM | POA: Diagnosis not present

## 2021-11-29 DIAGNOSIS — E785 Hyperlipidemia, unspecified: Secondary | ICD-10-CM

## 2021-11-29 DIAGNOSIS — E119 Type 2 diabetes mellitus without complications: Secondary | ICD-10-CM

## 2021-11-29 DIAGNOSIS — I10 Essential (primary) hypertension: Secondary | ICD-10-CM

## 2021-11-29 DIAGNOSIS — F32A Depression, unspecified: Secondary | ICD-10-CM

## 2021-11-29 DIAGNOSIS — Z23 Encounter for immunization: Secondary | ICD-10-CM | POA: Diagnosis not present

## 2021-11-29 DIAGNOSIS — F419 Anxiety disorder, unspecified: Secondary | ICD-10-CM

## 2021-11-29 DIAGNOSIS — Z0000 Encounter for general adult medical examination without abnormal findings: Secondary | ICD-10-CM

## 2021-11-29 DIAGNOSIS — E010 Iodine-deficiency related diffuse (endemic) goiter: Secondary | ICD-10-CM

## 2021-11-29 MED ORDER — METFORMIN HCL 1000 MG PO TABS: 1000.0000 mg | ORAL_TABLET | Freq: Two times a day (BID) | ORAL | 3 refills | Status: DC

## 2021-11-29 MED ORDER — CITALOPRAM HYDROBROMIDE 20 MG PO TABS: 20.0000 mg | ORAL_TABLET | Freq: Every day | ORAL | 3 refills | Status: DC

## 2021-11-29 MED ORDER — SIMVASTATIN 40 MG PO TABS: 40.0000 mg | ORAL_TABLET | Freq: Every evening | ORAL | 3 refills | Status: DC

## 2021-11-29 MED ORDER — GLIPIZIDE ER 5 MG PO TB24: 5.0000 mg | ORAL_TABLET | Freq: Every day | ORAL | 3 refills | Status: DC

## 2021-11-29 MED ORDER — LISINOPRIL 10 MG PO TABS: 10.0000 mg | ORAL_TABLET | Freq: Every day | ORAL | 3 refills | Status: DC

## 2021-11-29 NOTE — Progress Notes (Signed)
Office Note 11/29/2021  CC:  Chief Complaint  Patient presents with   Annual Exam    Pt is fasting. Declined HIV and Hep C screenings. Declined shingles vaccine at this time. Had GI referral placed on 07/04/21 but has not contacted them back to schedule yet. Would like flu vaccine completed today.    Patient is a 51 y.o. male who is here for annual health maintenance exam and 8 mo f/u HTN, HLD, DM 2. A/P as of last visit: "1) DM 2; no home monitoring.  Diet poor but compliant with metformin and glipizide. Hba1c and urine microalb/cr ordered.   2) HTN: well controlled on lisin 10 qd. Lytes/cr ordered.   3) HLD: tolerating simvastatin 40mg  qd longterm. LDL 68 about 1 yr ago. FLP and hepatic panel ordered."  INTERIM HX: Feeling fine, no concerns.  No home glucose monitoring. Occasional home blood pressure check is normal.  Has plans to make improvements in diet and exercise.   Past Medical History:  Diagnosis Date   Anxiety and depression    Asthma    DVT (deep venous thrombosis) ([place])    in context of surgery   Essential hypertension    Gout    History of blood transfusion    Hyperlipidemia    OSA (obstructive sleep apnea)    on CPAP   Osteoarthritis    Pulmonary embolism ([place])    in context of surgery   Rheumatoid arthritis ([place])    rheum not completely sure; possible simply osteoarth.  Was on DMARD at one point time.   Type 2 diabetes mellitus without complication (HCC)    Ureterolithiasis    L and R    Past Surgical History:  Procedure Laterality Date   EXTRACORPOREAL SHOCK WAVE LITHOTRIPSY Left 06/03/2018   Procedure: EXTRACORPOREAL SHOCK WAVE LITHOTRIPSY (ESWL);  Surgeon: Hollice Espy, MD;  Location: [facility] ORS;  Service: Urology;  Laterality: Left;   KNEE ARTHROSCOPY Right    KNEE SURGERY Left     Family History  Problem Relation Age of Onset   Heart disease Father    Heart attack Father 34       deceased   Colon cancer Mother 68   Breast cancer  Mother    Diabetes Paternal Grandmother     Social History   Socioeconomic History   Marital status: Married    Spouse name: Not on file   Number of children: Not on file   Years of education: Not on file   Highest education level: Not on file  Occupational History   Occupation: Disabled  Tobacco Use   Smoking status: Never   Smokeless tobacco: Current    Types: Snuff  Vaping Use   Vaping Use: Never used  Substance and Sexual Activity   Alcohol use: Yes    Alcohol/week: 1.0 standard drink    Types: 1 Cans of beer per week    Comment: social   Drug use: Never   Sexual activity: Yes    Birth control/protection: None  Other Topics Concern   Not on file  Social History Narrative   Married.   2 children.   Educ: BA in information systems and cybersecurity.   Occup: Medical illustrator. Disabled for a while due to knee/mobility issues--returned to work 02/2020.   No tob.   Alc: occ beer.   Enjoys watching movies, working out, spending time with dogs, traveling.   Social Determinants of Health   Financial Resource Strain: Low Risk    Difficulty  of Paying Living Expenses: Not hard at all  Food Insecurity: No Food Insecurity   Worried About [place] in the Last Year: Never true   [place] in the Last Year: Never true  Transportation Needs: No Transportation Needs   Lack of Transportation (Medical): No   Lack of Transportation (Non-Medical): No  Physical Activity: Sufficiently Active   Days of Exercise per Week: 4 days   Minutes of Exercise per Session: 40 min  Stress: No Stress Concern Present   Feeling of Stress : Not at all  Social Connections: Moderately Integrated   Frequency of Communication with Friends and Family: More than three times a week   Frequency of Social Gatherings with Friends and Family: More than three times a week   Attends Religious Services: Never   Marine scientist or Organizations: Yes   Attends Arts administrator: More than 4 times per year   Marital Status: Married  Human resources officer Violence: Not At Risk   Fear of Current or Ex-Partner: No   Emotionally Abused: No   Physically Abused: No   Sexually Abused: No    Outpatient Medications Prior to Visit  Medication Sig Dispense Refill   aspirin 81 MG tablet Take 81 mg by mouth daily.      Glucosamine 500 MG CAPS Take 1,000 mg by mouth daily.      loratadine (CLARITIN) 10 MG tablet Take 10 mg by mouth daily as needed for allergies.     Multiple Vitamin (MULTIVITAMIN) capsule Take 1 capsule by mouth daily.      VENTOLIN HFA 108 (90 Base) MCG/ACT inhaler INHALE 2 PUFFS INTO THE LUNGS EVERY 4 (FOUR) HOURS AS NEEDED FOR WHEEZING OR SHORTNESS OF BREATH. 18 g 0   citalopram (CELEXA) 20 MG tablet TAKE 1 TABLET (20 MG TOTAL) BY MOUTH DAILY. 90 tablet 1   glipiZIDE (GLUCOTROL XL) 5 MG 24 hr tablet Take 1 tablet (5 mg total) by mouth daily with breakfast. For diabetes. 90 tablet 3   lisinopril (ZESTRIL) 10 MG tablet TAKE 1 TABLET (10 MG TOTAL) BY MOUTH DAILY. 90 tablet 1   metFORMIN (GLUCOPHAGE) 1000 MG tablet Take 1 tablet (1,000 mg total) by mouth 2 (two) times daily with a meal. 180 tablet 3   simvastatin (ZOCOR) 40 MG tablet TAKE 1 TABLET (40 MG TOTAL) BY MOUTH EVERY EVENING. FOR CHOLESTEROL 90 tablet 1   sildenafil (REVATIO) 20 MG tablet Take 2-5 tablets by mouth 30 minutes prior to intercourse. (Patient not taking: No sig reported) 50 tablet 0   No facility-administered medications prior to visit.    Allergies  Allergen Reactions   Oxycodone-Acetaminophen Nausea Only    ROS Review of Systems  Constitutional:  Negative for appetite change, chills, fatigue and fever.  HENT:  Negative for congestion, dental problem, ear pain and sore throat.   Eyes:  Negative for discharge, redness and visual disturbance.  Respiratory:  Negative for cough, chest tightness, shortness of breath and wheezing.   Cardiovascular:  Negative for chest pain,  palpitations and leg swelling.  Gastrointestinal:  Negative for abdominal pain, blood in stool, diarrhea, nausea and vomiting.  Genitourinary:  Negative for difficulty urinating, dysuria, flank pain, frequency, hematuria and urgency.  Musculoskeletal:  Negative for arthralgias, back pain, joint swelling, myalgias and neck stiffness.  Skin:  Negative for pallor and rash.  Neurological:  Negative for dizziness, speech difficulty, weakness and headaches.  Hematological:  Negative for adenopathy. Does not bruise/bleed  easily.  Psychiatric/Behavioral:  Negative for confusion and sleep disturbance. The patient is not nervous/anxious.    PE; Vitals with BMI 11/29/2021 07/04/2021 03/01/2021  Height 6\' 6"  (No Data) 6\' 6"   Weight 324 lbs 13 oz (No Data) 329 lbs 13 oz  BMI [id number] - 123456  Systolic 123456 (No Data) [id number]  Diastolic 69 (No Data) 72  Pulse 81 - 94     Gen: Alert, well appearing.  Patient is oriented to person, place, time, and situation. AFFECT: pleasant, lucid thought and speech. ENT: Ears: EACs clear, normal epithelium.  TMs with good light reflex and landmarks bilaterally.  Eyes: no injection, icteris, swelling, or exudate.  EOMI, PERRLA. Nose: no drainage or turbinate edema/swelling.  No injection or focal lesion.  Mouth: lips without lesion/swelling.  Oral mucosa pink and moist.  Dentition intact and without obvious caries or gingival swelling.  Oropharynx without erythema, exudate, or swelling.  Neck: supple/nontender.  No LAD, mass.  Diffuse soft tissue prominence over lower anterior neck---question thyromegaly vs redundant subQ adipose tissue.  Carotid pulses 2+ bilaterally, without bruits. CV: RRR, no m/r/g.   LUNGS: CTA bilat, nonlabored resps, good aeration in all lung fields. ABD: soft, NT, ND, BS normal.  No hepatospenomegaly or mass.  No bruits. EXT: no clubbing, cyanosis, or edema.  Musculoskeletal: no joint swelling, erythema, warmth, or tenderness.  ROM of all joints  intact. Skin - no sores or suspicious lesions or rashes or color changes  Pertinent labs:  No results found for: TSH Lab Results  Component Value Date   WBC 10.9 (H) 12/18/2019   HGB 14.1 12/18/2019   HCT 41.3 12/18/2019   MCV 87.5 12/18/2019   PLT 249 12/18/2019   Lab Results  Component Value Date   CREATININE 1.25 03/01/2021   BUN 11 03/01/2021   NA 140 03/01/2021   K 4.0 03/01/2021   CL 104 03/01/2021   CO2 25 03/01/2021   Lab Results  Component Value Date   ALT 43 03/01/2021   AST 30 03/01/2021   ALKPHOS 50 04/10/2020   BILITOT 0.5 03/01/2021   Lab Results  Component Value Date   CHOL 147 03/01/2021   Lab Results  Component Value Date   HDL 39 (L) 03/01/2021   Lab Results  Component Value Date   LDLCALC 76 03/01/2021   Lab Results  Component Value Date   TRIG 228 (H) 03/01/2021   Lab Results  Component Value Date   CHOLHDL 3.8 03/01/2021   Lab Results  Component Value Date   HGBA1C 7.4 (H) 03/01/2021   ASSESSMENT AND PLAN:   #1 diabetes, no complications. Continue glipizide XL 5 mg a day and metformin 1000 mg twice a day. Feet exam normal today.  Eye exam up-to-date. Hemoglobin A1c today.  #2 hypertension, well controlled on lisinopril 10 mg a day.  3.  Hyperlipidemia.  Doing well on simvastatin 40 mg a day. Cholesterol panel and hepatic panel today.  4.  Question thyromegaly versus excess subQ adipose tissue--ordered ultrasound thyroid gland today. TSH today.  5.  Anxiety and depression: Stable on citalopram 20 mg a day.  6. Health maintenance exam: Reviewed age and gender appropriate health maintenance issues (prudent diet, regular exercise, health risks of tobacco and excessive alcohol, use of seatbelts, fire alarms in home, use of sunscreen).  Also reviewed age and gender appropriate health screening as well as vaccine recommendations. Vaccines: prevnar 20->deferred today.  Shingrix->deferred today.  Flu->updated today. Labs: HP labs  ordered Prostate ca screening:  PSA ordered Colon ca screening: referral ordered last year, pt has their contact info.  An After Visit Summary was printed and given to the patient.  FOLLOW UP:  Return in about 6 months (around 05/29/2022) for routine chronic illness f/u.  Signed:  Crissie Sickles, MD           11/29/2021

## 2021-11-29 NOTE — Patient Instructions (Signed)

## 2021-11-30 ENCOUNTER — Telehealth (HOSPITAL_BASED_OUTPATIENT_CLINIC_OR_DEPARTMENT_OTHER): Payer: Self-pay

## 2021-11-30 LAB — COMPREHENSIVE METABOLIC PANEL
AG Ratio: 1.4 (calc) (ref 1.0–2.5)
ALT: 28 U/L (ref 9–46)
AST: 20 U/L (ref 10–35)
Albumin: 4.3 g/dL (ref 3.6–5.1)
Alkaline phosphatase (APISO): 54 U/L (ref 35–144)
BUN: 11 mg/dL (ref 7–25)
CO2: 23 mmol/L (ref 20–32)
Calcium: 9.4 mg/dL (ref 8.6–10.3)
Chloride: 104 mmol/L (ref 98–110)
Creat: 1.12 mg/dL (ref 0.70–1.30)
Globulin: 3.1 g/dL (calc) (ref 1.9–3.7)
Glucose, Bld: 78 mg/dL (ref 65–99)
Potassium: 4.2 mmol/L (ref 3.5–5.3)
Sodium: 142 mmol/L (ref 135–146)
Total Bilirubin: 0.5 mg/dL (ref 0.2–1.2)
Total Protein: 7.4 g/dL (ref 6.1–8.1)

## 2021-11-30 LAB — HEMOGLOBIN A1C
Hgb A1c MFr Bld: 7.2 % of total Hgb — ABNORMAL HIGH (ref <5.7)
Mean Plasma Glucose: 160 mg/dL
eAG (mmol/L): 8.9 mmol/L

## 2021-11-30 LAB — CBC WITH DIFFERENTIAL/PLATELET
Absolute Monocytes: 770 cells/uL (ref 200–950)
Basophils Absolute: 43 cells/uL (ref 0–200)
Basophils Relative: 0.6 %
Eosinophils Absolute: 137 cells/uL (ref 15–500)
Eosinophils Relative: 1.9 %
HCT: 41.6 % (ref 38.5–50.0)
Hemoglobin: 14.5 g/dL (ref 13.2–17.1)
Lymphs Abs: 3053 cells/uL (ref 850–3900)
MCH: 30.3 pg (ref 27.0–33.0)
MCHC: 34.9 g/dL (ref 32.0–36.0)
MCV: 86.8 fL (ref 80.0–100.0)
MPV: 10.6 fL (ref 7.5–12.5)
Monocytes Relative: 10.7 %
Neutro Abs: 3197 cells/uL (ref 1500–7800)
Neutrophils Relative %: 44.4 %
Platelets: 240 10*3/uL (ref 140–400)
RBC: 4.79 10*6/uL (ref 4.20–5.80)
RDW: 13 % (ref 11.0–15.0)
Total Lymphocyte: 42.4 %
WBC: 7.2 10*3/uL (ref 3.8–10.8)

## 2021-11-30 LAB — LIPID PANEL
Cholesterol: 146 mg/dL (ref <200)
HDL: 41 mg/dL (ref >=40)
LDL Cholesterol (Calc): 75 mg/dL (calc)
Non-HDL Cholesterol (Calc): 105 mg/dL (calc) (ref <130)
Total CHOL/HDL Ratio: 3.6 (calc) (ref <5.0)
Triglycerides: 198 mg/dL — ABNORMAL HIGH (ref <150)

## 2021-11-30 LAB — TSH: TSH: 2.24 mIU/L (ref 0.40–4.50)

## 2021-12-13 ENCOUNTER — Encounter: Payer: Self-pay | Admitting: Family Medicine

## 2021-12-16 ENCOUNTER — Ambulatory Visit (HOSPITAL_BASED_OUTPATIENT_CLINIC_OR_DEPARTMENT_OTHER)
Admission: RE | Admit: 2021-12-16 | Discharge: 2021-12-16 | Disposition: A | Discharge status: Home / Self Care | Payer: Medicare Other | Source: Ambulatory Visit | Attending: Family Medicine | Admitting: Family Medicine

## 2021-12-16 ENCOUNTER — Other Ambulatory Visit: Payer: Self-pay

## 2021-12-16 DIAGNOSIS — E010 Iodine-deficiency related diffuse (endemic) goiter: Secondary | ICD-10-CM | POA: Insufficient documentation

## 2021-12-16 NOTE — Telephone Encounter (Signed)
Moshe Cipro will you do a letter stating "it is medically necessary that Nathan Krause avoid shaving". Thanks

## 2022-05-05 ENCOUNTER — Encounter: Payer: Self-pay | Admitting: Family Medicine

## 2022-05-06 MED ORDER — GLIPIZIDE ER 5 MG PO TB24: 5.0000 mg | ORAL_TABLET | Freq: Every day | ORAL | 0 refills | Status: DC

## 2022-05-06 MED ORDER — SIMVASTATIN 40 MG PO TABS: 40.0000 mg | ORAL_TABLET | Freq: Every evening | ORAL | 0 refills | Status: DC

## 2022-05-06 MED ORDER — CITALOPRAM HYDROBROMIDE 20 MG PO TABS: 20.0000 mg | ORAL_TABLET | Freq: Every day | ORAL | 0 refills | Status: DC

## 2022-05-06 MED ORDER — LISINOPRIL 10 MG PO TABS: 10.0000 mg | ORAL_TABLET | Freq: Every day | ORAL | 0 refills | Status: DC

## 2022-05-06 MED ORDER — METFORMIN HCL 1000 MG PO TABS: 1000.0000 mg | ORAL_TABLET | Freq: Two times a day (BID) | ORAL | 0 refills | Status: DC

## 2022-05-30 ENCOUNTER — Encounter: Payer: Self-pay | Admitting: Family Medicine

## 2022-05-30 ENCOUNTER — Ambulatory Visit (INDEPENDENT_AMBULATORY_CARE_PROVIDER_SITE_OTHER): Payer: Self-pay | Admitting: Family Medicine

## 2022-05-30 VITALS — BP 116/80 | HR 96 | Temp 98.0°F | Ht 78.0 in | Wt 319.8 lb

## 2022-05-30 DIAGNOSIS — E119 Type 2 diabetes mellitus without complications: Secondary | ICD-10-CM

## 2022-05-30 DIAGNOSIS — I10 Essential (primary) hypertension: Secondary | ICD-10-CM

## 2022-05-30 DIAGNOSIS — E7800 Pure hypercholesterolemia, unspecified: Secondary | ICD-10-CM

## 2022-05-30 LAB — POCT GLYCOSYLATED HEMOGLOBIN (HGB A1C)
HbA1c POC (<> result, manual entry): 6.5 % (ref 4.0–5.6)
HbA1c, POC (controlled diabetic range): 6.5 % (ref 0.0–7.0)
HbA1c, POC (prediabetic range): 6.5 % — AB (ref 5.7–6.4)
Hemoglobin A1C: 6.5 % — AB (ref 4.0–5.6)

## 2022-05-30 LAB — MICROALBUMIN / CREATININE URINE RATIO
Creatinine,U: 153.6 mg/dL
Microalb Creat Ratio: 0.6 mg/g (ref 0.0–30.0)
Microalb, Ur: 1 mg/dL (ref 0.0–1.9)

## 2022-05-30 NOTE — Progress Notes (Signed)
OFFICE VISIT  05/30/2022  CC:  Chief Complaint  Patient presents with   Diabetes   Hypertension   Hyperlipidemia   Patient is a 51 y.o. male who presents for 70-month follow-up diabetes, hypertension, and hyperlipidemia. A/P as of last visit: "#1 diabetes, no complications. Continue glipizide XL 5 mg a day and metformin 1000 mg twice a day. Feet exam normal today.  Eye exam up-to-date. Hemoglobin A1c today. #2 hypertension, well controlled on lisinopril 10 mg a day. #3.  Hyperlipidemia.  Doing well on simvastatin 40 mg a day. Cholesterol panel and hepatic panel today. #4.  Question thyromegaly versus excess subQ adipose tissue--ordered ultrasound thyroid gland today. TSH today. #5.  Anxiety and depression: Stable on citalopram 20 mg a day. #6. Health maintenance exam: Reviewed age and gender appropriate health maintenance issues (prudent diet, regular exercise, health risks of tobacco and excessive alcohol, use of seatbelts, fire alarms in home, use of sunscreen).  Also reviewed age and gender appropriate health screening as well as vaccine recommendations. Vaccines: prevnar 20->deferred today.  Shingrix->deferred today.  Flu->updated today. Labs: HP labs ordered Prostate ca screening: PSA ordered Colon ca screening: referral ordered last year, pt has their contact info."  INTERIM HX: Nathan Krause is feeling well. Has had some trouble keeping up with a good diet and exercise habits because he has had a lot of extra things going on the last 6 months or so.  He had a head cold about a month ago but recovered after about a week.  Home blood pressure monitoring occasionally is consistently less than 130/80. Does not check his home glucose regularly.  ROS as above, plus--> no fevers, no CP, no SOB, no wheezing, no cough, no dizziness, no HAs, no rashes, no melena/hematochezia.  No polyuria or polydipsia.  No myalgias or arthralgias.  No focal weakness, paresthesias, or tremors.  No acute  vision or hearing abnormalities.  No dysuria or unusual/new urinary urgency or frequency.  No recent changes in lower legs. No n/v/d or abd pain.  No palpitations.    Past Medical History:  Diagnosis Date   Anxiety and depression    Asthma    DVT (deep venous thrombosis) (HCC)    in context of surgery   Essential hypertension    Gout    History of blood transfusion    Hyperlipidemia    OSA (obstructive sleep apnea)    on CPAP   Osteoarthritis    Pulmonary embolism (HCC)    in context of surgery   Rheumatoid arthritis (HCC)    rheum not completely sure; possible simply osteoarth.  Was on DMARD at one point time.   Type 2 diabetes mellitus without complication (HCC)    Ureterolithiasis    L and R    Past Surgical History:  Procedure Laterality Date   EXTRACORPOREAL SHOCK WAVE LITHOTRIPSY Left 06/03/2018   Procedure: EXTRACORPOREAL SHOCK WAVE LITHOTRIPSY (ESWL);  Surgeon: Vanna Scotland, MD;  Location: [facility] ORS;  Service: Urology;  Laterality: Left;   KNEE ARTHROSCOPY Right    KNEE SURGERY Left     Outpatient Medications Prior to Visit  Medication Sig Dispense Refill   aspirin 81 MG tablet Take 81 mg by mouth daily.      citalopram (CELEXA) 20 MG tablet Take 1 tablet (20 mg total) by mouth daily. 30 tablet 0   glipiZIDE (GLUCOTROL XL) 5 MG 24 hr tablet Take 1 tablet (5 mg total) by mouth daily with breakfast. For diabetes. 30 tablet 0   Glucosamine  500 MG CAPS Take 1,000 mg by mouth daily.      lisinopril (ZESTRIL) 10 MG tablet Take 1 tablet (10 mg total) by mouth daily. 30 tablet 0   metFORMIN (GLUCOPHAGE) 1000 MG tablet Take 1 tablet (1,000 mg total) by mouth 2 (two) times daily with a meal. 60 tablet 0   Multiple Vitamin (MULTIVITAMIN) capsule Take 1 capsule by mouth daily.      simvastatin (ZOCOR) 40 MG tablet Take 1 tablet (40 mg total) by mouth every evening. for cholesterol 30 tablet 0   VENTOLIN HFA 108 (90 Base) MCG/ACT inhaler INHALE 2 PUFFS INTO THE LUNGS EVERY 4  (FOUR) HOURS AS NEEDED FOR WHEEZING OR SHORTNESS OF BREATH. 18 g 0   loratadine (CLARITIN) 10 MG tablet Take 10 mg by mouth daily as needed for allergies.     No facility-administered medications prior to visit.    Allergies  Allergen Reactions   Oxycodone-Acetaminophen Nausea Only    ROS As per HPI  PE:    05/30/2022    2:05 PM 11/29/2021    2:34 PM 03/01/2021    3:24 PM  Vitals with BMI  Height 6\' 6"  6\' 6"  6\' 6"   Weight 319 lbs 13 oz 324 lbs 13 oz 329 lbs 13 oz  BMI 36.96 37.54 38.12  Systolic 116 105  Diastolic 80 69 72  Pulse 96 81 94     Physical Exam  Gen: Alert, well appearing.  Patient is oriented to person, place, time, and situation.. AFFECT: pleasant, lucid thought and speech. CV: RRR, no m/r/g.   LUNGS: CTA bilat, nonlabored resps, good aeration in all lung fields. EXT: no clubbing or cyanosis.  no edema.    LABS:  Last CBC Lab Results  Component Value Date   WBC 7.2 11/29/2021   HGB 14.5 11/29/2021   HCT 41.6 11/29/2021   MCV 86.8 11/29/2021   MCH 30.3 11/29/2021   RDW 13.0 11/29/2021   PLT 240 11/29/2021   Last metabolic panel Lab Results  Component Value Date   GLUCOSE 78 11/29/2021   NA 142 11/29/2021   K 4.2 11/29/2021   CL 104 11/29/2021   CO2 23 11/29/2021   BUN 11 11/29/2021   CREATININE 1.12 11/29/2021   GFRNONAA >60 12/18/2019   CALCIUM 9.4 11/29/2021   PROT 7.4 11/29/2021   ALBUMIN 4.4 04/10/2020   BILITOT 0.5 11/29/2021   ALKPHOS 50 04/10/2020   AST 20 11/29/2021   ALT 28 11/29/2021   ANIONGAP 13 12/18/2019   Last lipids Lab Results  Component Value Date   CHOL 146 11/29/2021   HDL 41 11/29/2021   LDLCALC 75 11/29/2021   LDLDIRECT 114.0 03/17/2018   TRIG 198 (H) 11/29/2021   CHOLHDL 3.6 11/29/2021   Last hemoglobin A1c Lab Results  Component Value Date   HGBA1C 6.5 (A) 05/30/2022   HGBA1C 6.5 05/30/2022   HGBA1C 6.5 (A) 05/30/2022   HGBA1C 6.5 05/30/2022   Last thyroid functions Lab Results  Component  Value Date   TSH 2.24 11/29/2021   POC Hba1c today is 6.5% IMPRESSION AND PLAN:  1) Diabetes, no complications. Control is improved.  POC Hba1c today is 6.5%. Continue glipizide XL 5 mg a day and metformin 1000 mg twice a day. Urine microalbumin/creatinine today.  #2 hypertension, well controlled on lisinopril 10 mg a day. Check electrolytes and creatinine today.  #3 hypercholesterolemia.  Doing well long-term on simvastatin 40 mg a day LDL was 75 about 6 months ago. Hepatic panel  today. Plan repeat lipid panel 6 months  An After Visit Summary was printed and given to the patient.  FOLLOW UP: Return in about 6 months (around 11/30/2022) for annual CPE (fasting).  Signed:  Santiago Bumpers, MD           05/30/2022

## 2022-05-31 LAB — COMPREHENSIVE METABOLIC PANEL
AG Ratio: 1.4 (calc) (ref 1.0–2.5)
ALT: 35 U/L (ref 9–46)
AST: 22 U/L (ref 10–35)
Albumin: 4.4 g/dL (ref 3.6–5.1)
Alkaline phosphatase (APISO): 58 U/L (ref 35–144)
BUN: 10 mg/dL (ref 7–25)
CO2: 29 mmol/L (ref 20–32)
Calcium: 9.8 mg/dL (ref 8.6–10.3)
Chloride: 101 mmol/L (ref 98–110)
Creat: 1.11 mg/dL (ref 0.70–1.30)
Globulin: 3.2 g/dL (calc) (ref 1.9–3.7)
Glucose, Bld: 136 mg/dL — ABNORMAL HIGH (ref 65–99)
Potassium: 4.5 mmol/L (ref 3.5–5.3)
Sodium: 139 mmol/L (ref 135–146)
Total Bilirubin: 0.4 mg/dL (ref 0.2–1.2)
Total Protein: 7.6 g/dL (ref 6.1–8.1)

## 2022-06-20 ENCOUNTER — Other Ambulatory Visit: Payer: Self-pay | Admitting: Family Medicine

## 2022-06-27 ENCOUNTER — Other Ambulatory Visit: Payer: Self-pay | Admitting: Family Medicine

## 2022-07-14 NOTE — Telephone Encounter (Signed)
Left another voicemail for patient to schedule Medicare wellness visit.

## 2022-12-05 ENCOUNTER — Encounter: Payer: Self-pay | Admitting: Family Medicine

## 2022-12-16 ENCOUNTER — Other Ambulatory Visit: Payer: Self-pay | Admitting: Family Medicine

## 2022-12-31 NOTE — Patient Instructions (Signed)
Health Maintenance, Male Adopting a healthy lifestyle and getting preventive care are important in promoting health and wellness. Ask your health care provider about: The right schedule for you to have regular tests and exams. Things you can do on your own to prevent diseases and keep yourself healthy. What should I know about diet, weight, and exercise? Eat a healthy diet  Eat a diet that includes plenty of vegetables, fruits, low-fat dairy products, and lean protein. Do not eat a lot of foods that are high in solid fats, added sugars, or sodium. Maintain a healthy weight Body mass index (BMI) is a measurement that can be used to identify possible weight problems. It estimates body fat based on height and weight. Your health care provider can help determine your BMI and help you achieve or maintain a healthy weight. Get regular exercise Get regular exercise. This is one of the most important things you can do for your health. Most adults should: Exercise for at least 150 minutes each week. The exercise should increase your heart rate and make you sweat (moderate-intensity exercise). Do strengthening exercises at least twice a week. This is in addition to the moderate-intensity exercise. Spend less time sitting. Even light physical activity can be beneficial. Watch cholesterol and blood lipids Have your blood tested for lipids and cholesterol at 52 years of age, then have this test every 5 years. You may need to have your cholesterol levels checked more often if: Your lipid or cholesterol levels are high. You are older than 52 years of age. You are at high risk for heart disease. What should I know about cancer screening? Many types of cancers can be detected early and may often be prevented. Depending on your health history and family history, you may need to have cancer screening at various ages. This may include screening for: Colorectal cancer. Prostate cancer. Skin cancer. Lung  cancer. What should I know about heart disease, diabetes, and high blood pressure? Blood pressure and heart disease High blood pressure causes heart disease and increases the risk of stroke. This is more likely to develop in people who have high blood pressure readings or are overweight. Talk with your health care provider about your target blood pressure readings. Have your blood pressure checked: Every 3-5 years if you are 18-39 years of age. Every year if you are 40 years old or older. If you are between the ages of 65 and 75 and are a current or former smoker, ask your health care provider if you should have a one-time screening for abdominal aortic aneurysm (AAA). Diabetes Have regular diabetes screenings. This checks your fasting blood sugar level. Have the screening done: Once every three years after age 45 if you are at a normal weight and have a low risk for diabetes. More often and at a younger age if you are overweight or have a high risk for diabetes. What should I know about preventing infection? Hepatitis B If you have a higher risk for hepatitis B, you should be screened for this virus. Talk with your health care provider to find out if you are at risk for hepatitis B infection. Hepatitis C Blood testing is recommended for: Everyone born from 1945 through 1965. Anyone with known risk factors for hepatitis C. Sexually transmitted infections (STIs) You should be screened each year for STIs, including gonorrhea and chlamydia, if: You are sexually active and are younger than 52 years of age. You are older than 52 years of age and your   health care provider tells you that you are at risk for this type of infection. Your sexual activity has changed since you were last screened, and you are at increased risk for chlamydia or gonorrhea. Ask your health care provider if you are at risk. Ask your health care provider about whether you are at high risk for HIV. Your health care provider  may recommend a prescription medicine to help prevent HIV infection. If you choose to take medicine to prevent HIV, you should first get tested for HIV. You should then be tested every 3 months for as long as you are taking the medicine. Follow these instructions at home: Alcohol use Do not drink alcohol if your health care provider tells you not to drink. If you drink alcohol: Limit how much you have to 0-2 drinks a day. Know how much alcohol is in your drink. In the [place] one drink equals one 12 oz bottle of beer (355 mL), one 5 oz glass of wine (148 mL), or one 1 oz glass of hard liquor (44 mL). Lifestyle Do not use any products that contain nicotine or tobacco. These products include cigarettes, chewing tobacco, and vaping devices, such as e-cigarettes. If you need help quitting, ask your health care provider. Do not use street drugs. Do not share needles. Ask your health care provider for help if you need support or information about quitting drugs. General instructions Schedule regular health, dental, and eye exams. Stay current with your vaccines. Tell your health care provider if: You often feel depressed. You have ever been abused or do not feel safe at home. Summary Adopting a healthy lifestyle and getting preventive care are important in promoting health and wellness. Follow your health care provider's instructions about healthy diet, exercising, and getting tested or screened for diseases. Follow your health care provider's instructions on monitoring your cholesterol and blood pressure. This information is not intended to replace advice given to you by your health care provider. Make sure you discuss any questions you have with your health care provider. Document Revised: 03/04/2021 Document Reviewed: 03/04/2021 Elsevier Patient Education  2023 Elsevier Inc.  

## 2023-01-02 ENCOUNTER — Ambulatory Visit: Payer: BC Managed Care – PPO | Admitting: Family Medicine

## 2023-01-02 ENCOUNTER — Encounter: Payer: Self-pay | Admitting: Family Medicine

## 2023-01-02 VITALS — BP 138/84 | HR 107 | Temp 98.0°F | Ht 78.0 in | Wt 321.0 lb

## 2023-01-02 DIAGNOSIS — Z23 Encounter for immunization: Secondary | ICD-10-CM

## 2023-01-02 DIAGNOSIS — Z0000 Encounter for general adult medical examination without abnormal findings: Secondary | ICD-10-CM

## 2023-01-02 DIAGNOSIS — Z800 Family history of malignant neoplasm of digestive organs: Secondary | ICD-10-CM

## 2023-01-02 DIAGNOSIS — E7800 Pure hypercholesterolemia, unspecified: Secondary | ICD-10-CM | POA: Diagnosis not present

## 2023-01-02 DIAGNOSIS — Z125 Encounter for screening for malignant neoplasm of prostate: Secondary | ICD-10-CM

## 2023-01-02 DIAGNOSIS — I10 Essential (primary) hypertension: Secondary | ICD-10-CM | POA: Diagnosis not present

## 2023-01-02 DIAGNOSIS — Z1211 Encounter for screening for malignant neoplasm of colon: Secondary | ICD-10-CM

## 2023-01-02 DIAGNOSIS — E119 Type 2 diabetes mellitus without complications: Secondary | ICD-10-CM

## 2023-01-02 LAB — POCT GLYCOSYLATED HEMOGLOBIN (HGB A1C)
HbA1c POC (<> result, manual entry): 6.6 % (ref 4.0–5.6)
HbA1c, POC (controlled diabetic range): 6.6 % (ref 0.0–7.0)
HbA1c, POC (prediabetic range): 6.6 % — AB (ref 5.7–6.4)
Hemoglobin A1C: 6.6 % — AB (ref 4.0–5.6)

## 2023-01-02 MED ORDER — LISINOPRIL 10 MG PO TABS: 10.0000 mg | ORAL_TABLET | Freq: Every day | ORAL | 1 refills | Status: DC

## 2023-01-02 MED ORDER — GLIPIZIDE ER 5 MG PO TB24: ORAL_TABLET | ORAL | 1 refills | Status: DC

## 2023-01-02 MED ORDER — CITALOPRAM HYDROBROMIDE 20 MG PO TABS: 20.0000 mg | ORAL_TABLET | Freq: Every day | ORAL | 1 refills | Status: DC

## 2023-01-02 MED ORDER — SIMVASTATIN 40 MG PO TABS: ORAL_TABLET | ORAL | 1 refills | Status: DC

## 2023-01-02 MED ORDER — METFORMIN HCL 1000 MG PO TABS: 1000.0000 mg | ORAL_TABLET | Freq: Two times a day (BID) | ORAL | 1 refills | Status: DC

## 2023-01-02 NOTE — Progress Notes (Signed)
Office Note 01/02/2023  CC:  Chief Complaint  Patient presents with   Annual Exam    Pt is not fasting    HPI:  Patient is a 52 y.o. male who is here for annual health maintenance exam and 104-monthfollow-up diabetes, hypertension, hyperlipidemia, and anxiety.  Feeling well. Has been working hard on diet and activity level. No home blood pressure monitoring.  Today had a very stressful day, was in a meeting in which several people lost her temper and threatened to quit.  This has his blood pressure and heart rate a little bit up today.  Overall, mood and anxiety levels are stable on citalopram 20 mg a day.  Past Medical History:  Diagnosis Date   Anxiety and depression    Asthma    DVT (deep venous thrombosis) ([facility]    in context of surgery   Essential hypertension    Gout    History of blood transfusion    Hyperlipidemia    OSA (obstructive sleep apnea)    on CPAP   Osteoarthritis    Pulmonary embolism ([facility]    in context of surgery   Rheumatoid arthritis ([facility]    rheum not completely sure; possible simply osteoarth.  Was on DMARD at one point time.   Type 2 diabetes mellitus without complication (HCC)    Ureterolithiasis    L and R    Past Surgical History:  Procedure Laterality Date   EXTRACORPOREAL SHOCK WAVE LITHOTRIPSY Left 06/03/2018   Procedure: EXTRACORPOREAL SHOCK WAVE LITHOTRIPSY (ESWL);  Surgeon: BHollice Espy MD;  Location: [facility] ORS;  Service: Urology;  Laterality: Left;   KNEE ARTHROSCOPY Right    KNEE SURGERY Left     Family History  Problem Relation Age of Onset   Heart disease Father    Heart attack Father [phone]      deceased   Colon cancer Mother [phone]  Breast cancer Mother    Diabetes Paternal Grandmother     Social History   Socioeconomic History   Marital status: Married    Spouse name: Not on file   Number of children: Not on file   Years of education: Not on file   Highest education level: Not on file  Occupational History    Occupation: Disabled  Tobacco Use   Smoking status: Never   Smokeless tobacco: Current    Types: Snuff  Vaping Use   Vaping Use: Never used  Substance and Sexual Activity   Alcohol use: Yes    Alcohol/week: 1.0 standard drink of alcohol    Types: 1 Cans of beer per week    Comment: social   Drug use: Never   Sexual activity: Yes    Birth control/protection: None  Other Topics Concern   Not on file  Social History Narrative   Married.   2 children.   Educ: BA in information systems and cybersecurity.   Occup: NMedical illustrator Disabled for a while due to knee/mobility issues--returned to work 02/2020.   No tob.   Alc: occ beer.   Enjoys watching movies, working out, spending time with dogs, traveling.   Social Determinants of Health   Financial Resource Strain: Low Risk  (07/04/2021)   Overall Financial Resource Strain (CARDIA)    Difficulty of Paying Living Expenses: Not hard at all  Food Insecurity: No Food Insecurity (07/04/2021)   Hunger Vital Sign    Worried About Running Out of Food in the Last Year: Never true    Ran  Out of Food in the Last Year: Never true  Transportation Needs: No Transportation Needs (07/04/2021)   PRAPARE - Hydrologist (Medical): No    Lack of Transportation (Non-Medical): No  Physical Activity: Sufficiently Active (07/04/2021)   Exercise Vital Sign    Days of Exercise per Week: 4 days    Minutes of Exercise per Session: 40 min  Stress: No Stress Concern Present (07/04/2021)   [place]    Feeling of Stress : Not at all  Social Connections: Moderately Integrated (07/04/2021)   Social Connection and Isolation Panel [NHANES]    Frequency of Communication with Friends and Family: More than three times a week    Frequency of Social Gatherings with Friends and Family: More than three times a week    Attends Religious Services: Never    Marine scientist  or Organizations: Yes    Attends Music therapist: More than 4 times per year    Marital Status: Married  Human resources officer Violence: Not At Risk (07/04/2021)   Humiliation, Afraid, Rape, and Kick questionnaire    Fear of Current or Ex-Partner: No    Emotionally Abused: No    Physically Abused: No    Sexually Abused: No    Outpatient Medications Prior to Visit  Medication Sig Dispense Refill   aspirin 81 MG tablet Take 81 mg by mouth daily.      Glucosamine 500 MG CAPS Take 1,000 mg by mouth daily.      Multiple Vitamin (MULTIVITAMIN) capsule Take 1 capsule by mouth daily.      VENTOLIN HFA 108 (90 Base) MCG/ACT inhaler INHALE 2 PUFFS INTO THE LUNGS EVERY 4 (FOUR) HOURS AS NEEDED FOR WHEEZING OR SHORTNESS OF BREATH. 18 g 0   citalopram (CELEXA) 20 MG tablet Take 1 tablet (20 mg total) by mouth daily. 30 tablet 0   glipiZIDE (GLUCOTROL XL) 5 MG 24 hr tablet TAKE ONE TABLET BY MOUTH ONE TIME DAILY WITH BREAKFAST FOR DIABETES 30 tablet 0   lisinopril (ZESTRIL) 10 MG tablet TAKE ONE TABLET BY MOUTH ONE TIME DAILY 30 tablet 0   metFORMIN (GLUCOPHAGE) 1000 MG tablet TAKE ONE TABLET BY MOUTH TWICE DAILY WITH A MEAL 60 tablet 0   simvastatin (ZOCOR) 40 MG tablet TAKE ONE TABLET BY MOUTH DAILY IN THE EVENING FOR CHOLESTEROL 30 tablet 0   No facility-administered medications prior to visit.    Allergies  Allergen Reactions   Oxycodone-Acetaminophen Nausea Only    Review of Systems  Constitutional:  Negative for appetite change, chills, fatigue and fever.  HENT:  Negative for congestion, dental problem, ear pain and sore throat.   Eyes:  Negative for discharge, redness and visual disturbance.  Respiratory:  Negative for cough, chest tightness, shortness of breath and wheezing.   Cardiovascular:  Negative for chest pain, palpitations and leg swelling.  Gastrointestinal:  Negative for abdominal pain, blood in stool, diarrhea, nausea and vomiting.  Genitourinary:  Negative for  difficulty urinating, dysuria, flank pain, frequency, hematuria and urgency.  Musculoskeletal:  Negative for arthralgias, back pain, joint swelling, myalgias and neck stiffness.  Skin:  Negative for pallor and rash.  Neurological:  Negative for dizziness, speech difficulty, weakness and headaches.  Hematological:  Negative for adenopathy. Does not bruise/bleed easily.  Psychiatric/Behavioral:  Negative for confusion and sleep disturbance. The patient is not nervous/anxious.    PE;    01/02/2023    2:46 PM  05/30/2022    2:05 PM 11/29/2021    2:34 PM  Vitals with BMI  Height '6\' 6"'$  '6\' 6"'$  '6\' 6"'$   Weight 321 lbs 319 lbs 13 oz 324 lbs 13 oz  BMI 37.1 [id number]  Systolic 0000000 [id number]  Diastolic 84 80 69  Pulse [id number] 96 81   Gen: Alert, well appearing.  Patient is oriented to person, place, time, and situation. AFFECT: pleasant, lucid thought and speech. ENT: Ears: EACs clear, normal epithelium.  TMs with good light reflex and landmarks bilaterally.  Eyes: no injection, icteris, swelling, or exudate.  EOMI, PERRLA. Nose: no drainage or turbinate edema/swelling.  No injection or focal lesion.  Mouth: lips without lesion/swelling.  Oral mucosa pink and moist.  Dentition intact and without obvious caries or gingival swelling.  Oropharynx without erythema, exudate, or swelling.  Neck: supple/nontender.  No LAD, mass, or TM.  Carotid pulses 2+ bilaterally, without bruits. CV: RRR, no m/r/g.   LUNGS: CTA bilat, nonlabored resps, good aeration in all lung fields. ABD: soft, NT, ND, BS normal.  No hepatospenomegaly or mass.  No bruits. EXT: no clubbing, cyanosis, or edema.  Musculoskeletal: no joint swelling, erythema, warmth, or tenderness.  ROM of all joints intact. Skin - no sores or suspicious lesions or rashes or color changes Foot exam -  no swelling, tenderness or skin or vascular lesions. Color and temperature is normal. Sensation is intact. Peripheral pulses are palpable. Toenails are  normal.  Pertinent labs:  Lab Results  Component Value Date   TSH 2.24 11/29/2021   Lab Results  Component Value Date   WBC 7.2 11/29/2021   HGB 14.5 11/29/2021   HCT 41.6 11/29/2021   MCV 86.8 11/29/2021   PLT 240 11/29/2021   Lab Results  Component Value Date   CREATININE 1.11 05/30/2022   BUN 10 05/30/2022   NA 139 05/30/2022   K 4.5 05/30/2022   CL 101 05/30/2022   CO2 29 05/30/2022   Lab Results  Component Value Date   ALT 35 05/30/2022   AST 22 05/30/2022   ALKPHOS 50 04/10/2020   BILITOT 0.4 05/30/2022   Lab Results  Component Value Date   CHOL 146 11/29/2021   Lab Results  Component Value Date   HDL 41 11/29/2021   Lab Results  Component Value Date   LDLCALC 75 11/29/2021   Lab Results  Component Value Date   TRIG 198 (H) 11/29/2021   Lab Results  Component Value Date   CHOLHDL 3.6 11/29/2021   Lab Results  Component Value Date   HGBA1C 6.6 (A) 01/02/2023   HGBA1C 6.6 01/02/2023   HGBA1C 6.6 (A) 01/02/2023   HGBA1C 6.6 01/02/2023   ASSESSMENT AND PLAN:   1 health maintenance exam: Reviewed age and gender appropriate health maintenance issues (prudent diet, regular exercise, health risks of tobacco and excessive alcohol, use of seatbelts, fire alarms in home, use of sunscreen).  Also reviewed age and gender appropriate health screening as well as vaccine recommendations. Vaccines: prevnar 20->deferred today.  Shingrix->deferred today.  Labs: HP, Hba1c, PSA Prostate ca screening: PSA ordered Colon ca screening: Due for initial screening-->he deferred for now, will call back to request referral when ready.  #2 diabetes without complication, well-controlled on glipizide XL 5 mg a day and metformin 1000 mg twice daily. Point-of-care hemoglobin A1c 6.6 % today. Feet exam normal today.  #3 hypertension, well-controlled on lisinopril 10 mg a day.  4.  Hypercholesterolemia, doing well on simvastatin 40 mg  a day.  #5 GAD, doing well long-term  on citalopram 20 mg a day.  Not fasting today--> he will return next week when fasting to get lab work.  An After Visit Summary was printed and given to the patient.  FOLLOW UP:  Return in about 6 months (around 07/05/2023) for routine chronic illness f/u.  Signed:  Crissie Sickles, MD           01/02/2023

## 2023-01-05 ENCOUNTER — Other Ambulatory Visit (INDEPENDENT_AMBULATORY_CARE_PROVIDER_SITE_OTHER): Payer: BC Managed Care – PPO

## 2023-01-05 DIAGNOSIS — E7800 Pure hypercholesterolemia, unspecified: Secondary | ICD-10-CM

## 2023-01-05 DIAGNOSIS — I10 Essential (primary) hypertension: Secondary | ICD-10-CM

## 2023-01-05 DIAGNOSIS — E119 Type 2 diabetes mellitus without complications: Secondary | ICD-10-CM

## 2023-01-05 DIAGNOSIS — Z125 Encounter for screening for malignant neoplasm of prostate: Secondary | ICD-10-CM

## 2023-01-05 LAB — COMPREHENSIVE METABOLIC PANEL
ALT: 37 U/L (ref 0–53)
AST: 22 U/L (ref 0–37)
Albumin: 4.1 g/dL (ref 3.5–5.2)
Alkaline Phosphatase: 52 U/L (ref 39–117)
BUN: 13 mg/dL (ref 6–23)
CO2: 28 mEq/L (ref 19–32)
Calcium: 9.7 mg/dL (ref 8.4–10.5)
Chloride: 101 mEq/L (ref 96–112)
Creatinine, Ser: 1.23 mg/dL (ref 0.40–1.50)
GFR: 67.68 mL/min (ref >60.00)
Glucose, Bld: 136 mg/dL — ABNORMAL HIGH (ref 70–99)
Potassium: 4.3 mEq/L (ref 3.5–5.1)
Sodium: 139 mEq/L (ref 135–145)
Total Bilirubin: 0.5 mg/dL (ref 0.2–1.2)
Total Protein: 7.1 g/dL (ref 6.0–8.3)

## 2023-01-05 LAB — LIPID PANEL
Cholesterol: 173 mg/dL (ref 0–200)
HDL: 43.7 mg/dL (ref >39.00)
NonHDL: 129.17
Total CHOL/HDL Ratio: 4
Triglycerides: 219 mg/dL — ABNORMAL HIGH (ref 0.0–149.0)
VLDL: 43.8 mg/dL — ABNORMAL HIGH (ref 0.0–40.0)

## 2023-01-05 LAB — CBC
HCT: 45.4 % (ref 39.0–52.0)
Hemoglobin: 15.2 g/dL (ref 13.0–17.0)
MCHC: 33.4 g/dL (ref 30.0–36.0)
MCV: 91.2 fl (ref 78.0–100.0)
Platelets: 258 10*3/uL (ref 150.0–400.0)
RBC: 4.98 Mil/uL (ref 4.22–5.81)
RDW: 13.4 % (ref 11.5–15.5)
WBC: 6.8 10*3/uL (ref 4.0–10.5)

## 2023-01-05 LAB — TSH: TSH: 3.29 u[IU]/mL (ref 0.35–5.50)

## 2023-01-05 LAB — LDL CHOLESTEROL, DIRECT: Direct LDL: 110 mg/dL

## 2023-01-05 LAB — PSA: PSA: 0.62 ng/mL (ref 0.10–4.00)

## 2023-01-07 ENCOUNTER — Other Ambulatory Visit: Payer: Self-pay | Admitting: Family Medicine

## 2023-01-07 DIAGNOSIS — E782 Mixed hyperlipidemia: Secondary | ICD-10-CM

## 2023-01-07 MED ORDER — ROSUVASTATIN CALCIUM 20 MG PO TABS: 20.0000 mg | ORAL_TABLET | Freq: Every day | ORAL | 2 refills | Status: DC

## 2023-04-29 ENCOUNTER — Other Ambulatory Visit: Payer: Self-pay | Admitting: Family Medicine

## 2023-04-29 ENCOUNTER — Encounter: Payer: Self-pay | Admitting: Family Medicine

## 2023-04-29 ENCOUNTER — Other Ambulatory Visit: Payer: Self-pay

## 2023-04-29 MED ORDER — ROSUVASTATIN CALCIUM 20 MG PO TABS: 20.0000 mg | ORAL_TABLET | Freq: Every day | ORAL | 2 refills | Status: DC

## 2023-07-27 ENCOUNTER — Other Ambulatory Visit: Payer: Self-pay | Admitting: Family Medicine

## 2023-08-24 ENCOUNTER — Other Ambulatory Visit: Payer: Self-pay | Admitting: Family Medicine

## 2023-08-24 NOTE — Telephone Encounter (Signed)
No further action needed.

## 2023-08-24 NOTE — Telephone Encounter (Signed)
Pt is over due for appt.

## 2023-09-08 ENCOUNTER — Ambulatory Visit (INDEPENDENT_AMBULATORY_CARE_PROVIDER_SITE_OTHER): Payer: BC Managed Care – PPO

## 2023-09-08 VITALS — Wt 321.0 lb

## 2023-09-08 DIAGNOSIS — Z0000 Encounter for general adult medical examination without abnormal findings: Secondary | ICD-10-CM

## 2023-09-08 NOTE — Patient Instructions (Signed)
Nathan Krause , Thank you for taking time to come for your Medicare Wellness Visit. I appreciate your ongoing commitment to your health goals. Please review the following plan we discussed and let me know if I can assist you in the future.   Referrals/Orders/Follow-Ups/Clinician Recommendations:  Aim for 30 minutes of exercise or brisk walking, 6-8 glasses of water, and 5 servings of fruits and vegetables each day. Continue working on losing weight   This is a list of the screening recommended for you and due dates:  Health Maintenance  Topic Date Due   Zoster (Shingles) Vaccine (1 of 2) Never done   Medicare Annual Wellness Visit  07/04/2022   Flu Shot  05/28/2023   Yearly kidney health urinalysis for diabetes  05/31/2023   COVID-19 Vaccine (1 - 2023-24 season) Never done   Hemoglobin A1C  07/05/2023   Eye exam for diabetics  07/28/2023   Colon Cancer Screening  01/02/2024*   Hepatitis C Screening  01/02/2024*   HIV Screening  01/02/2024*   Complete foot exam   01/02/2024   Yearly kidney function blood test for diabetes  01/05/2024   DTaP/Tdap/Td vaccine (3 - Td or Tdap) 03/25/2027   HPV Vaccine  Aged Out  *Topic was postponed. The date shown is not the original due date.    Advanced directives: (Declined) Advance directive discussed with you today. Even though you declined this today, please call our office should you change your mind, and we can give you the proper paperwork for you to fill out.  Next Medicare Annual Wellness Visit scheduled for next year: Yes

## 2023-09-08 NOTE — Progress Notes (Signed)
Subjective:   Nathan Krause is a 52 y.o. male who presents for Medicare Annual/Subsequent preventive examination.  Visit Complete: Virtual I connected with  Vito Backers on 09/08/23 by a audio enabled telemedicine application and verified that I am speaking with the correct person using two identifiers.  Patient Location: Home  Provider Location: Office/Clinic  I discussed the limitations of evaluation and management by telemedicine. The patient expressed understanding and agreed to proceed.  Vital Signs: Because this visit was a virtual/telehealth visit, some criteria may be missing or patient reported. Any vitals not documented were not able to be obtained and vitals that have been documented are patient reported.  Patient Medicare AWV questionnaire was completed by the patient on 09/07/23; I have confirmed that all information answered by patient is correct and no changes since this date.  Cardiac Risk Factors include: advanced age (>43men, >70 women);diabetes mellitus;dyslipidemia;hypertension;male gender;obesity (BMI >30kg/m2)     Objective:    Today's Vitals   09/08/23 1427  Weight: (!) 321 lb (145.6 kg)   Body mass index is 37.1 kg/m.     09/08/2023    2:30 PM 07/04/2021    1:20 PM 12/18/2019    2:10 AM 05/25/2018    9:56 PM  Advanced Directives  Does Patient Have a Medical Advance Directive? No Yes No No  Type of Special educational needs teacher of [place];Living will    Copy of Healthcare Power of Attorney in Chart?  No - copy requested    Would patient like information on creating a medical advance directive? No - Patient declined   No - Patient declined    Current Medications (verified) Outpatient Encounter Medications as of 09/08/2023  Medication Sig   aspirin 81 MG tablet Take 81 mg by mouth daily.    citalopram (CELEXA) 20 MG tablet TAKE ONE TABLET BY MOUTH ONCE A DAY **NEEDS OFFICE VISIT FOR FURTHER REFILLS**   glipiZIDE (GLUCOTROL XL) 5 MG 24 hr  tablet TAKE ONE TABLET BY MOUTH ONE TIME DAILY WITH BREAKFAST FOR DIABETES   Glucosamine 500 MG CAPS Take 1,000 mg by mouth daily.    lisinopril (ZESTRIL) 10 MG tablet TAKE ONE TABLET BY MOUTH ONCE A DAY   metFORMIN (GLUCOPHAGE) 1000 MG tablet TAKE ONE TABLET BY MOUTH TWICE DAILY WITH A MEAL   Multiple Vitamin (MULTIVITAMIN) capsule Take 1 capsule by mouth daily.    rosuvastatin (CRESTOR) 20 MG tablet TAKE ONE TABLET BY MOUTH ONCE A DAY **NEEDS OFFICE VISIT FOR FURTHER REFILLS**   VENTOLIN HFA 108 (90 Base) MCG/ACT inhaler INHALE 2 PUFFS INTO THE LUNGS EVERY 4 (FOUR) HOURS AS NEEDED FOR WHEEZING OR SHORTNESS OF BREATH.   No facility-administered encounter medications on file as of 09/08/2023.    Allergies (verified) Oxycodone-acetaminophen   History: Past Medical History:  Diagnosis Date   Anxiety and depression    Asthma    DVT (deep venous thrombosis) (HCC)    in context of surgery   Essential hypertension    Gout    History of blood transfusion    Hyperlipidemia    OSA (obstructive sleep apnea)    on CPAP   Osteoarthritis    Pulmonary embolism (HCC)    in context of surgery   Rheumatoid arthritis (HCC)    rheum not completely sure; possible simply osteoarth.  Was on DMARD at one point time.   Type 2 diabetes mellitus without complication (HCC)    Ureterolithiasis    L and R   Past  Surgical History:  Procedure Laterality Date   EXTRACORPOREAL SHOCK WAVE LITHOTRIPSY Left 06/03/2018   Procedure: EXTRACORPOREAL SHOCK WAVE LITHOTRIPSY (ESWL);  Surgeon: Vanna Scotland, MD;  Location: [facility] ORS;  Service: Urology;  Laterality: Left;   KNEE ARTHROSCOPY Right    KNEE SURGERY Left    Family History  Problem Relation Age of Onset   Heart disease Father    Heart attack Father 3       deceased   Colon cancer Mother 79   Breast cancer Mother    Diabetes Paternal Grandmother    Social History   Socioeconomic History   Marital status: Married    Spouse name: Not on file    Number of children: Not on file   Years of education: Not on file   Highest education level: Not on file  Occupational History   Occupation: Disabled  Tobacco Use   Smoking status: Never   Smokeless tobacco: Current    Types: Snuff  Vaping Use   Vaping status: Never Used  Substance and Sexual Activity   Alcohol use: Yes    Alcohol/week: 1.0 standard drink of alcohol    Types: 1 Cans of beer per week    Comment: social   Drug use: Never   Sexual activity: Yes    Birth control/protection: None  Other Topics Concern   Not on file  Social History Narrative   Married.   2 children.   Educ: BA in information systems and cybersecurity.   Occup: Warden/ranger. Disabled for a while due to knee/mobility issues--returned to work 02/2020.   No tob.   Alc: occ beer.   Enjoys watching movies, working out, spending time with dogs, traveling.   Social Determinants of Health   Financial Resource Strain: Low Risk  (09/07/2023)   Overall Financial Resource Strain (CARDIA)    Difficulty of Paying Living Expenses: Not hard at all  Food Insecurity: No Food Insecurity (09/07/2023)   Hunger Vital Sign    Worried About Running Out of Food in the Last Year: Never true    Ran Out of Food in the Last Year: Never true  Transportation Needs: No Transportation Needs (09/07/2023)   PRAPARE - Administrator, Civil Service (Medical): No    Lack of Transportation (Non-Medical): No  Physical Activity: Sufficiently Active (09/07/2023)   Exercise Vital Sign    Days of Exercise per Week: 5 days    Minutes of Exercise per Session: 60 min  Stress: No Stress Concern Present (09/07/2023)   [employer] of Occupational Health - Occupational Stress Questionnaire    Feeling of Stress : Not at all  Social Connections: Socially Integrated (09/07/2023)   Social Connection and Isolation Panel [NHANES]    Frequency of Communication with Friends and Family: More than three times a week     Frequency of Social Gatherings with Friends and Family: Twice a week    Attends Religious Services: 1 to 4 times per year    Active Member of [employer] or Organizations: Yes    Attends Banker Meetings: 1 to 4 times per year    Marital Status: Married    Tobacco Counseling Ready to quit: Not Answered Counseling given: Not Answered   Clinical Intake:  Pre-visit preparation completed: Yes  Pain : No/denies pain     BMI - recorded: 37.1 Nutritional Status: BMI > 30  Obese Diabetes: Yes CBG done?: No Did pt. bring in CBG monitor from home?: No  How often  do you need to have someone help you when you read instructions, pamphlets, or other written materials from your doctor or pharmacy?: 1 - Never  Interpreter Needed?: No  Information entered by :: Lanier Ensign, LPN   Activities of Daily Living    09/07/2023    3:41 PM  In your present state of health, do you have any difficulty performing the following activities:  Hearing? 0  Vision? 0  Difficulty concentrating or making decisions? 0  Walking or climbing stairs? 0  Dressing or bathing? 0  Doing errands, shopping? 0  Preparing Food and eating ? N  Using the Toilet? N  In the past six months, have you accidently leaked urine? N  Do you have problems with loss of bowel control? N  Managing your Medications? N  Managing your Finances? N  Housekeeping or managing your Housekeeping? N    Patient Care Team: Jeoffrey Massed, MD as PCP - General (Family Medicine)  Indicate any recent Medical Services you may have received from other than [facility] providers in the past year (date may be approximate).     Assessment:   This is a routine wellness examination for Nathan Krause.  Hearing/Vision screen Hearing Screening - Comments:: Pt stated slight hearing loss Vision Screening - Comments:: Pt follows up with [facility] for annual eye exams    Goals Addressed             This Visit's Progress    Patient  Stated       Lose weight        Depression Screen    09/08/2023    2:31 PM 01/02/2023    2:46 PM 11/29/2021    2:46 PM 07/04/2021    1:30 PM 03/01/2021    3:59 PM 08/07/2020    8:35 AM 03/24/2017    8:30 AM  PHQ 2/9 Scores  PHQ - 2 Score 0 0 0 0 0 0 0  PHQ- 9 Score   2  2      Fall Risk    09/07/2023    3:41 PM 07/04/2021    1:19 PM  Fall Risk   Falls in the past year? 0 1  Number falls in past yr: 0 0  Injury with Fall? 0 0  Risk for fall due to : Impaired vision   Follow up Falls prevention discussed Falls evaluation completed;Falls prevention discussed;Education provided    MEDICARE RISK AT HOME: Medicare Risk at Home Any stairs in or around the home?: Yes If so, are there any without handrails?: No Home free of loose throw rugs in walkways, pet beds, electrical cords, etc?: Yes Adequate lighting in your home to reduce risk of falls?: Yes Life alert?: No Use of a cane, walker or w/c?: No Grab bars in the bathroom?: No Shower chair or bench in shower?: No Elevated toilet seat or a handicapped toilet?: No  TIMED UP AND GO:  Was the test performed?  No    Cognitive Function:        09/08/2023    2:33 PM  6CIT Screen  What Year? 0 points  What month? 0 points  What time? 0 points  Count back from 20 0 points  Months in reverse 0 points  Repeat phrase 0 points  Total Score 0 points    Immunizations Immunization History  Administered Date(s) Administered   Influenza Split 07/28/2011, 08/16/2012   Influenza, Seasonal, Injecte, Preservative Fre 09/26/2014   Influenza,inj,Quad PF,6+ Mos 09/19/2015, 08/11/2019, 08/07/2020, 11/29/2021  Influenza-Unspecified 08/08/2013   Meningococcal polysaccharide vaccine (MPSV4) 08/17/2011   Pneumococcal Polysaccharide-23 08/10/2015   Td 03/24/2017   Tdap 03/28/2007    TDAP status: Up to date  Flu Vaccine status: Due, Education has been provided regarding the importance of this vaccine. Advised may receive this vaccine  at local pharmacy or Health Dept. Aware to provide a copy of the vaccination record if obtained from local pharmacy or Health Dept. Verbalized acceptance and understanding.    Covid-19 vaccine status: Information provided on how to obtain vaccines.   Qualifies for Shingles Vaccine? Yes   Zostavax completed No   Shingrix Completed?: No.    Education has been provided regarding the importance of this vaccine. Patient has been advised to call insurance company to determine out of pocket expense if they have not yet received this vaccine. Advised may also receive vaccine at local pharmacy or Health Dept. Verbalized acceptance and understanding.  Screening Tests Health Maintenance  Topic Date Due   Zoster Vaccines- Shingrix (1 of 2) Never done   Diabetic kidney evaluation - Urine ACR  05/31/2023   COVID-19 Vaccine (1 - 2023-24 season) Never done   HEMOGLOBIN A1C  07/05/2023   OPHTHALMOLOGY EXAM  07/28/2023   Colonoscopy  01/02/2024 (Originally 11/23/2015)   Hepatitis C Screening  01/02/2024 (Originally 11/22/1988)   HIV Screening  01/02/2024 (Originally 11/22/1985)   INFLUENZA VACCINE  01/25/2024 (Originally 05/28/2023)   FOOT EXAM  01/02/2024   Diabetic kidney evaluation - eGFR measurement  01/05/2024   Medicare Annual Wellness (AWV)  09/07/2024   DTaP/Tdap/Td (3 - Td or Tdap) 03/25/2027   HPV VACCINES  Aged Out    Health Maintenance  Health Maintenance Due  Topic Date Due   Zoster Vaccines- Shingrix (1 of 2) Never done   Diabetic kidney evaluation - Urine ACR  05/31/2023   COVID-19 Vaccine (1 - 2023-24 season) Never done   HEMOGLOBIN A1C  07/05/2023   OPHTHALMOLOGY EXAM  07/28/2023    Postponed colonoscopy   Additional Screening:  Hepatitis C Screening: does qualify;   Vision Screening: Recommended annual ophthalmology exams for early detection of glaucoma and other disorders of the eye. Is the patient up to date with their annual eye exam?  No  Who is the provider or what is  the name of the office in which the patient attends annual eye exams? [facility]  If pt is not established with a provider, would they like to be referred to a provider to establish care? No .   Dental Screening: Recommended annual dental exams for proper oral hygiene  Diabetic Foot Exam: Diabetic Foot Exam: Completed 01/02/23  Community Resource Referral / Chronic Care Management: CRR required this visit?  No   CCM required this visit?  No     Plan:     I have personally reviewed and noted the following in the patient's chart:   Medical and social history Use of alcohol, tobacco or illicit drugs  Current medications and supplements including opioid prescriptions. Patient is not currently taking opioid prescriptions. Functional ability and status Nutritional status Physical activity Advanced directives List of other physicians Hospitalizations, surgeries, and ER visits in previous 12 months Vitals Screenings to include cognitive, depression, and falls Referrals and appointments  In addition, I have reviewed and discussed with patient certain preventive protocols, quality metrics, and best practice recommendations. A written personalized care plan for preventive services as well as general preventive health recommendations were provided to patient.     Nathan Krause  Nathan Sonntag, LPN   16/07/[id number]   After Visit Summary: (MyChart) Due to this being a telephonic visit, the after visit summary with patients personalized plan was offered to patient via MyChart   Nurse Notes: none

## 2023-09-10 ENCOUNTER — Encounter: Payer: Self-pay | Admitting: Family Medicine

## 2023-09-10 ENCOUNTER — Ambulatory Visit (INDEPENDENT_AMBULATORY_CARE_PROVIDER_SITE_OTHER): Payer: Self-pay | Admitting: Family Medicine

## 2023-09-10 VITALS — BP 120/83 | HR 90 | Wt 314.6 lb

## 2023-09-10 DIAGNOSIS — E119 Type 2 diabetes mellitus without complications: Secondary | ICD-10-CM

## 2023-09-10 DIAGNOSIS — J4520 Mild intermittent asthma, uncomplicated: Secondary | ICD-10-CM

## 2023-09-10 DIAGNOSIS — I10 Essential (primary) hypertension: Secondary | ICD-10-CM

## 2023-09-10 DIAGNOSIS — Z7984 Long term (current) use of oral hypoglycemic drugs: Secondary | ICD-10-CM

## 2023-09-10 DIAGNOSIS — E782 Mixed hyperlipidemia: Secondary | ICD-10-CM

## 2023-09-10 DIAGNOSIS — F411 Generalized anxiety disorder: Secondary | ICD-10-CM

## 2023-09-10 LAB — MICROALBUMIN / CREATININE URINE RATIO
Creatinine,U: 137.7 mg/dL
Microalb Creat Ratio: 0.5 mg/g (ref 0.0–30.0)
Microalb, Ur: <0.7 mg/dL (ref 0.0–1.9)

## 2023-09-10 LAB — POCT GLYCOSYLATED HEMOGLOBIN (HGB A1C)
HbA1c POC (<> result, manual entry): 6.7 % (ref 4.0–5.6)
HbA1c, POC (controlled diabetic range): 6.7 % (ref 0.0–7.0)
HbA1c, POC (prediabetic range): 6.7 % — AB (ref 5.7–6.4)
Hemoglobin A1C: 6.7 % — AB (ref 4.0–5.6)

## 2023-09-10 MED ORDER — LISINOPRIL 10 MG PO TABS: 10.0000 mg | ORAL_TABLET | Freq: Every day | ORAL | 1 refills | Status: DC

## 2023-09-10 MED ORDER — CITALOPRAM HYDROBROMIDE 20 MG PO TABS: 20.0000 mg | ORAL_TABLET | Freq: Every day | ORAL | 1 refills | Status: DC

## 2023-09-10 MED ORDER — CITALOPRAM HYDROBROMIDE 10 MG PO TABS: 10.0000 mg | ORAL_TABLET | Freq: Every day | ORAL | 0 refills | Status: DC

## 2023-09-10 MED ORDER — ALBUTEROL SULFATE HFA 108 (90 BASE) MCG/ACT IN AERS: 2.0000 | INHALATION_SPRAY | RESPIRATORY_TRACT | 0 refills | Status: AC | PRN

## 2023-09-10 MED ORDER — GLIPIZIDE ER 5 MG PO TB24: ORAL_TABLET | ORAL | 1 refills | Status: DC

## 2023-09-10 MED ORDER — ROSUVASTATIN CALCIUM 20 MG PO TABS: 20.0000 mg | ORAL_TABLET | Freq: Every day | ORAL | 1 refills | Status: DC

## 2023-09-10 MED ORDER — METFORMIN HCL 1000 MG PO TABS: 1000.0000 mg | ORAL_TABLET | Freq: Two times a day (BID) | ORAL | 1 refills | Status: DC

## 2023-09-10 NOTE — Progress Notes (Signed)
OFFICE VISIT  09/10/2023  CC:  Chief Complaint  Patient presents with   Medical Management of Chronic Issues    Pt is not fasting.     Patient is a 52 y.o. male who presents for 95-month follow-up diabetes, hypertension, GAD, and hypercholesterolemia. A/P as of last visit: "1 diabetes without complication, well-controlled on glipizide XL 5 mg a day and metformin 1000 mg twice daily. Point-of-care hemoglobin A1c 6.6 % today. Feet exam normal today.   #2 hypertension, well-controlled on lisinopril 10 mg a day.   3.  Hypercholesterolemia, doing well on simvastatin 40 mg a day.   4. GAD, doing well long-term on citalopram 20 mg a day"  INTERIM HX: Triglycerides and LDL not at goal back in March so I switched him from simvastatin to rosuvastatin.  Nathan Krause is feeling well. He has a new job as a Multimedia programmer and finds that he is much more active with this job, going up and down stairs a lot.  He is eating healthier than in the past.  He denies any depression or anxiety lately. He does say he feels like he has had emotional blunting over the Last year or 2. He wonders if this is related to his citalopram. He started citalopram about 4 years ago when he was going through a lot of tough times, particularly knee problems and being out of work. Past all this now.  He like to wean off citalopram.  ROS as above, plus--> no fevers, no CP, no SOB, no wheezing, no cough, no dizziness, no HAs, no rashes, no melena/hematochezia.  No polyuria or polydipsia.  No myalgias or arthralgias.  No focal weakness, paresthesias, or tremors.  No acute vision or hearing abnormalities.  No dysuria or unusual/new urinary urgency or frequency.  No recent changes in lower legs. No n/v/d or abd pain.  No palpitations.    Past Medical History:  Diagnosis Date   Anxiety and depression    Asthma    DVT (deep venous thrombosis) (HCC)    in context of surgery   Essential hypertension    Gout    History of  blood transfusion    Hyperlipidemia    OSA (obstructive sleep apnea)    on CPAP   Osteoarthritis    Pulmonary embolism (HCC)    in context of surgery   Rheumatoid arthritis (HCC)    rheum not completely sure; possible simply osteoarth.  Was on DMARD at one point time.   Type 2 diabetes mellitus without complication (HCC)    Ureterolithiasis    L and R    Past Surgical History:  Procedure Laterality Date   EXTRACORPOREAL SHOCK WAVE LITHOTRIPSY Left 06/03/2018   Procedure: EXTRACORPOREAL SHOCK WAVE LITHOTRIPSY (ESWL);  Surgeon: Vanna Scotland, MD;  Location: [facility] ORS;  Service: Urology;  Laterality: Left;   KNEE ARTHROSCOPY Right    KNEE SURGERY Left     Outpatient Medications Prior to Visit  Medication Sig Dispense Refill   aspirin 81 MG tablet Take 81 mg by mouth daily.      citalopram (CELEXA) 20 MG tablet TAKE ONE TABLET BY MOUTH ONCE A DAY **NEEDS OFFICE VISIT FOR FURTHER REFILLS** 14 tablet 0   glipiZIDE (GLUCOTROL XL) 5 MG 24 hr tablet TAKE ONE TABLET BY MOUTH ONE TIME DAILY WITH BREAKFAST FOR DIABETES 90 tablet 1   Glucosamine 500 MG CAPS Take 1,000 mg by mouth daily.      lisinopril (ZESTRIL) 10 MG tablet TAKE ONE TABLET BY MOUTH ONCE  A DAY 14 tablet 0   metFORMIN (GLUCOPHAGE) 1000 MG tablet TAKE ONE TABLET BY MOUTH TWICE DAILY WITH A MEAL 28 tablet 0   Multiple Vitamin (MULTIVITAMIN) capsule Take 1 capsule by mouth daily.      rosuvastatin (CRESTOR) 20 MG tablet TAKE ONE TABLET BY MOUTH ONCE A DAY **NEEDS OFFICE VISIT FOR FURTHER REFILLS** 14 tablet 0   VENTOLIN HFA 108 (90 Base) MCG/ACT inhaler INHALE 2 PUFFS INTO THE LUNGS EVERY 4 (FOUR) HOURS AS NEEDED FOR WHEEZING OR SHORTNESS OF BREATH. 18 g 0   No facility-administered medications prior to visit.    Allergies  Allergen Reactions   Oxycodone-Acetaminophen Nausea Only    Review of Systems As per HPI  PE:    09/10/2023   10:28 AM 09/08/2023    2:27 PM 01/02/2023    2:46 PM  Vitals with BMI  Height   6\' 6"    Weight 314 lbs 10 oz 321 lbs 321 lbs  BMI   37.1  Systolic 120  138  Diastolic 83  84  Pulse 90  107     Physical Exam  Gen: Alert, well appearing.  Patient is oriented to person, place, time, and situation. AFFECT: pleasant, lucid thought and speech. No further exam today  LABS:  Last CBC Lab Results  Component Value Date   WBC 6.8 01/05/2023   HGB 15.2 01/05/2023   HCT 45.4 01/05/2023   MCV 91.2 01/05/2023   MCH 30.3 11/29/2021   RDW 13.4 01/05/2023   PLT 258.0 01/05/2023   Last metabolic panel Lab Results  Component Value Date   GLUCOSE 136 (H) 01/05/2023   NA 139 01/05/2023   K 4.3 01/05/2023   CL 101 01/05/2023   CO2 28 01/05/2023   BUN 13 01/05/2023   CREATININE 1.23 01/05/2023   GFR 67.68 01/05/2023   CALCIUM 9.7 01/05/2023   PROT 7.1 01/05/2023   ALBUMIN 4.1 01/05/2023   BILITOT 0.5 01/05/2023   ALKPHOS 52 01/05/2023   AST 22 01/05/2023   ALT 37 01/05/2023   ANIONGAP 13 12/18/2019   Last lipids Lab Results  Component Value Date   CHOL 173 01/05/2023   HDL 43.70 01/05/2023   LDLCALC 75 11/29/2021   LDLDIRECT 110.0 01/05/2023   TRIG 219.0 (H) 01/05/2023   CHOLHDL 4 01/05/2023   Last hemoglobin A1c Lab Results  Component Value Date   HGBA1C 6.6 (A) 01/02/2023   HGBA1C 6.6 01/02/2023   HGBA1C 6.6 (A) 01/02/2023   HGBA1C 6.6 01/02/2023   Last thyroid functions Lab Results  Component Value Date   TSH 3.29 01/05/2023   Lab Results  Component Value Date   HGBA1C 6.6 (A) 01/02/2023   HGBA1C 6.6 01/02/2023   HGBA1C 6.6 (A) 01/02/2023   HGBA1C 6.6 01/02/2023   IMPRESSION AND PLAN:  #1 diabetes without complication, well-controlled on glipizide XL 5 mg a day and metformin 1000 mg twice daily. Point-of-care hemoglobin A1c 6.7 % today. Urine microalb/cr today.   #2 hypertension, well-controlled on lisinopril 10 mg a day.   3.  Hypercholesterolemia, doing well on simvastatin 40 mg a day.  4.  Emotional blunting. Very likely related to  citalopram. He is in long-term remission from period of anxiety and depression. Will wean off citalopram now:  Take citalopram 10mg  whole tab daily for 15d then decrease to 1/2 tab daily for 15d, then stop.  He is not fasting today. I reviewed labs from March 2024. Okay to hold off from repeat today.  Plan repeat  labs at next visit in 4 to 6 months.  An After Visit Summary was printed and given to the patient.  FOLLOW UP: No follow-ups on file. Next CPE 12/2023 Signed:  Santiago Bumpers, MD           09/10/2023

## 2024-01-12 ENCOUNTER — Encounter: Payer: Self-pay | Admitting: Family Medicine

## 2024-02-16 ENCOUNTER — Other Ambulatory Visit: Payer: Self-pay | Admitting: Family Medicine

## 2024-03-09 ENCOUNTER — Ambulatory Visit (INDEPENDENT_AMBULATORY_CARE_PROVIDER_SITE_OTHER): Admitting: Family Medicine

## 2024-03-09 ENCOUNTER — Encounter: Payer: Self-pay | Admitting: Family Medicine

## 2024-03-09 VITALS — BP 125/85 | HR 98 | Temp 97.9°F | Ht 78.0 in | Wt 313.0 lb

## 2024-03-09 DIAGNOSIS — Z0000 Encounter for general adult medical examination without abnormal findings: Secondary | ICD-10-CM | POA: Diagnosis not present

## 2024-03-09 DIAGNOSIS — Z7984 Long term (current) use of oral hypoglycemic drugs: Secondary | ICD-10-CM | POA: Diagnosis not present

## 2024-03-09 DIAGNOSIS — E7800 Pure hypercholesterolemia, unspecified: Secondary | ICD-10-CM | POA: Diagnosis not present

## 2024-03-09 DIAGNOSIS — Z125 Encounter for screening for malignant neoplasm of prostate: Secondary | ICD-10-CM

## 2024-03-09 DIAGNOSIS — E119 Type 2 diabetes mellitus without complications: Secondary | ICD-10-CM | POA: Diagnosis not present

## 2024-03-09 DIAGNOSIS — I10 Essential (primary) hypertension: Secondary | ICD-10-CM

## 2024-03-09 LAB — POCT GLYCOSYLATED HEMOGLOBIN (HGB A1C)
HbA1c POC (<> result, manual entry): 7 % (ref 4.0–5.6)
HbA1c, POC (controlled diabetic range): 7 % (ref 0.0–7.0)
HbA1c, POC (prediabetic range): 7 % — AB (ref 5.7–6.4)
Hemoglobin A1C: 7 % — AB (ref 4.0–5.6)

## 2024-03-09 MED ORDER — LISINOPRIL 10 MG PO TABS: 10.0000 mg | ORAL_TABLET | Freq: Every day | ORAL | 3 refills | Status: AC

## 2024-03-09 MED ORDER — METFORMIN HCL 1000 MG PO TABS: 1000.0000 mg | ORAL_TABLET | Freq: Two times a day (BID) | ORAL | 3 refills | Status: AC

## 2024-03-09 MED ORDER — GLIPIZIDE ER 5 MG PO TB24: ORAL_TABLET | ORAL | 3 refills | Status: DC

## 2024-03-09 MED ORDER — ROSUVASTATIN CALCIUM 20 MG PO TABS: 20.0000 mg | ORAL_TABLET | Freq: Every day | ORAL | 3 refills | Status: AC

## 2024-03-09 NOTE — Patient Instructions (Addendum)
   Please have your eye exam results faxed to our office at [phone]. Your A1c today was 7.0

## 2024-03-09 NOTE — Progress Notes (Signed)
 Office Note 03/09/2024  CC:  Chief Complaint  Patient presents with   Medical Management of Chronic Issues    Pt is not fasting; R shoulder (cortisone injection) about 1.5 months ago. Eye exam scheduled for 1st week of June   Patient is a 53 y.o. male who is here for annual health maintenance exam and 29-month follow-up diabetes, hypertension, hypercholesterolemia, and history of blunted emotions. A/P as of last visit: "#1 diabetes without complication, well-controlled on glipizide  XL 5 mg a day and metformin  1000 mg twice daily. Point-of-care hemoglobin A1c 6.7 % today. Urine microalb/cr today.   #2 hypertension, well-controlled on lisinopril  10 mg a day.   3.  Hypercholesterolemia, doing well on simvastatin  40 mg a day.   4.  Emotional blunting. Very likely related to citalopram . He is in long-term remission from period of anxiety and depression. Will wean off citalopram  now:  Take citalopram  10mg  whole tab daily for 15d then decrease to 1/2 tab daily for 15d, then stop.   He is not fasting today. I reviewed labs from March 2024. Okay to hold off from repeat today.  Plan repeat labs at next visit in 4 to 6 months."  INTERIM HX: Nathan Krause feels well. Home glucoses have around normal for the most part.  He hardly ever feels any sensation of low sugar.  Past Medical History:  Diagnosis Date   Anxiety and depression    Asthma    DVT (deep venous thrombosis) (HCC)    in context of surgery   Essential hypertension    Gout    History of blood transfusion    Hyperlipidemia    OSA (obstructive sleep apnea)    on CPAP   Osteoarthritis    Pulmonary embolism (HCC)    in context of surgery   Rheumatoid arthritis (HCC)    rheum not completely sure; possible simply osteoarth.  Was on DMARD at one point time.   Type 2 diabetes mellitus without complication (HCC)    Ureterolithiasis    L and R    Past Surgical History:  Procedure Laterality Date   EXTRACORPOREAL SHOCK WAVE  LITHOTRIPSY Left 06/03/2018   Procedure: EXTRACORPOREAL SHOCK WAVE LITHOTRIPSY (ESWL);  Surgeon: Dustin Gimenez, MD;  Location: [facility] ORS;  Service: Urology;  Laterality: Left;   KNEE ARTHROSCOPY Right    KNEE SURGERY Left     Family History  Problem Relation Age of Onset   Heart disease Father    Heart attack Father 4       deceased   Colon cancer Mother 40   Breast cancer Mother    Diabetes Paternal Grandmother     Social History   Socioeconomic History   Marital status: Married    Spouse name: Not on file   Number of children: Not on file   Years of education: Not on file   Highest education level: Master's degree (e.g., MA, MS, MEng, MEd, MSW, MBA)  Occupational History   Occupation: Disabled  Tobacco Use   Smoking status: Never   Smokeless tobacco: Current    Types: Snuff  Vaping Use   Vaping status: Never Used  Substance and Sexual Activity   Alcohol use: Yes    Alcohol/week: 1.0 standard drink of alcohol    Types: 1 Cans of beer per week    Comment: social   Drug use: Never   Sexual activity: Yes    Birth control/protection: None  Other Topics Concern   Not on file  Social History Narrative  Married.   2 children.   Educ: BA in information systems and cybersecurity.   Occup: Warden/ranger. Disabled for a while due to knee/mobility issues--returned to work 02/2020.   No tob.   Alc: occ beer.   Enjoys watching movies, working out, spending time with dogs, traveling.   Social Drivers of Corporate investment banker Strain: Low Risk  (03/08/2024)   Overall Financial Resource Strain (CARDIA)    Difficulty of Paying Living Expenses: Not hard at all  Food Insecurity: Food Insecurity Present (03/08/2024)   Hunger Vital Sign    Worried About Running Out of Food in the Last Year: Sometimes true    Ran Out of Food in the Last Year: Never true  Transportation Needs: No Transportation Needs (03/08/2024)   PRAPARE - Administrator, Civil Service  (Medical): No    Lack of Transportation (Non-Medical): No  Physical Activity: Sufficiently Active (03/08/2024)   Exercise Vital Sign    Days of Exercise per Week: 4 days    Minutes of Exercise per Session: 70 min  Stress: No Stress Concern Present (03/08/2024)   [employer] of Occupational Health - Occupational Stress Questionnaire    Feeling of Stress : Only a little  Social Connections: Socially Integrated (03/08/2024)   Social Connection and Isolation Panel [NHANES]    Frequency of Communication with Friends and Family: More than three times a week    Frequency of Social Gatherings with Friends and Family: Twice a week    Attends Religious Services: 1 to 4 times per year    Active Member of [employer] or Organizations: Yes    Attends Banker Meetings: 1 to 4 times per year    Marital Status: Married  Catering manager Violence: Not At Risk (09/08/2023)   Humiliation, Afraid, Rape, and Kick questionnaire    Fear of Current or Ex-Partner: No    Emotionally Abused: No    Physically Abused: No    Sexually Abused: No    Outpatient Medications Prior to Visit  Medication Sig Dispense Refill   aspirin 81 MG tablet Take 81 mg by mouth daily.      Glucosamine 500 MG CAPS Take 1,000 mg by mouth daily.      Multiple Vitamin (MULTIVITAMIN) capsule Take 1 capsule by mouth daily.      albuterol  (VENTOLIN  HFA) 108 (90 Base) MCG/ACT inhaler Inhale 2 puffs into the lungs every 4 (four) hours as needed for wheezing or shortness of breath. (Patient not taking: Reported on 03/09/2024) 18 g 0   citalopram  (CELEXA ) 10 MG tablet Take 1 tablet (10 mg total) by mouth daily. (Patient not taking: Reported on 03/09/2024) 30 tablet 0   glipiZIDE  (GLUCOTROL  XL) 5 MG 24 hr tablet TAKE ONE TABLET BY MOUTH DAILY WITH BREAKFAST FOR DIABETES 90 tablet 0   lisinopril  (ZESTRIL ) 10 MG tablet TAKE ONE TABLET BY MOUTH ONCE A DAY 30 tablet 0   metFORMIN  (GLUCOPHAGE ) 1000 MG tablet TAKE ONE TABLET BY MOUTH TWICE  DAILY WITH A MEAL 60 tablet 0   rosuvastatin  (CRESTOR ) 20 MG tablet TAKE ONE TABLET BY MOUTH ONCE A DAY 30 tablet 0   No facility-administered medications prior to visit.    Allergies  Allergen Reactions   Oxycodone -Acetaminophen  Nausea Only    Review of Systems  Constitutional:  Negative for appetite change, chills, fatigue and fever.  HENT:  Negative for congestion, dental problem, ear pain and sore throat.   Eyes:  Negative for discharge, redness  and visual disturbance.  Respiratory:  Negative for cough, chest tightness, shortness of breath and wheezing.   Cardiovascular:  Negative for chest pain, palpitations and leg swelling.  Gastrointestinal:  Negative for abdominal pain, blood in stool, diarrhea, nausea and vomiting.  Genitourinary:  Negative for difficulty urinating, dysuria, flank pain, frequency, hematuria and urgency.  Musculoskeletal:  Negative for arthralgias, back pain, joint swelling, myalgias and neck stiffness.  Skin:  Negative for pallor and rash.  Neurological:  Negative for dizziness, speech difficulty, weakness and headaches.  Hematological:  Negative for adenopathy. Does not bruise/bleed easily.  Psychiatric/Behavioral:  Negative for confusion and sleep disturbance. The patient is not nervous/anxious.     PE;    03/09/2024    3:52 PM 09/10/2023   10:28 AM 09/08/2023    2:27 PM  Vitals with BMI  Height 6\' 6"     Weight 313 lbs 314 lbs 10 oz 321 lbs  BMI 36.18    Systolic 125 120   Diastolic 85 83   Pulse 98 90     Gen: Alert, well appearing.  Patient is oriented to person, place, time, and situation. AFFECT: pleasant, lucid thought and speech. ENT: Ears: EACs clear, normal epithelium.  TMs with good light reflex and landmarks bilaterally.  Eyes: no injection, icteris, swelling, or exudate.  EOMI, PERRLA. Nose: no drainage or turbinate edema/swelling.  No injection or focal lesion.  Mouth: lips without lesion/swelling.  Oral mucosa pink and moist.   Dentition intact and without obvious caries or gingival swelling.  Oropharynx without erythema, exudate, or swelling.  Neck: supple/nontender.  No LAD, mass, or TM.  Carotid pulses 2+ bilaterally, without bruits. CV: RRR, no m/r/g.   LUNGS: CTA bilat, nonlabored resps, good aeration in all lung fields. ABD: soft, NT, ND, BS normal.  No hepatospenomegaly or mass.  No bruits. EXT: no clubbing, cyanosis, or edema.  Musculoskeletal: no joint swelling, erythema, warmth, or tenderness.  ROM of all joints intact. + impingement signs R shoulder.  Mild posterolateral acromial tenderness on R. Skin - no sores or suspicious lesions or rashes or color changes  Pertinent labs:  Lab Results  Component Value Date   TSH 3.29 01/05/2023   Lab Results  Component Value Date   WBC 6.8 01/05/2023   HGB 15.2 01/05/2023   HCT 45.4 01/05/2023   MCV 91.2 01/05/2023   PLT 258.0 01/05/2023   Lab Results  Component Value Date   CREATININE 1.23 01/05/2023   BUN 13 01/05/2023   NA 139 01/05/2023   K 4.3 01/05/2023   CL 101 01/05/2023   CO2 28 01/05/2023   Lab Results  Component Value Date   ALT 37 01/05/2023   AST 22 01/05/2023   ALKPHOS 52 01/05/2023   BILITOT 0.5 01/05/2023   Lab Results  Component Value Date   CHOL 173 01/05/2023   Lab Results  Component Value Date   HDL 43.70 01/05/2023   Lab Results  Component Value Date   LDLCALC 75 11/29/2021   Lab Results  Component Value Date   TRIG 219.0 (H) 01/05/2023   Lab Results  Component Value Date   CHOLHDL 4 01/05/2023   Lab Results  Component Value Date   PSA 0.62 01/05/2023   Lab Results  Component Value Date   HGBA1C 7.0 (A) 03/09/2024   HGBA1C 7.0 03/09/2024   HGBA1C 7.0 (A) 03/09/2024   HGBA1C 7.0 03/09/2024   ASSESSMENT AND PLAN:   1 health maintenance exam: Reviewed age and gender appropriate health  maintenance issues (prudent diet, regular exercise, health risks of tobacco and excessive alcohol, use of seatbelts,  fire alarms in home, use of sunscreen).  Also reviewed age and gender appropriate health screening as well as vaccine recommendations. Vaccines: prevnar 20->deferred today.  Shingrix->deferred today.  Labs: HP, Hba1c, PSA Prostate ca screening: PSA ordered Colon ca screening: Due for initial screening-->he deferred for now, will call back to request referral when ready.  #2 diabetes without complication. POC Hba1c is 7.0% today. Continue Glucotrol  XL 5 mg a day and metformin  1000 mg twice a day. Monitor serum creatinine/GFR today.  #3 hypercholesterolemia, doing well long-term on rosuvastatin  20 mg a day. Lipid panel and hepatic panel today (he last ate about 4 hours ago).  #4 hypertension, controlled on lisinopril  10 mg a day. Electrolytes and creatinine monitoring today.  #5 right shoulder pain. He had onset of pain a couple of months ago without preceding incident or trauma. He went to [facility] and reports that his x-ray showed what sounds like a osteophyte per his description today and they did a steroid injection.  He was possibly going to get an MRI if not significantly better upon follow-up.  He says he did get some significant relief from the steroid but still has problems raising the shoulder and reaching out. He will be following up with Ortho soon.  An After Visit Summary was printed and given to the patient.  FOLLOW UP:  Return in about 6 months (around 09/09/2024) for routine chronic illness f/u.  Signed:  Arletha Lady, MD           03/09/2024

## 2024-03-10 LAB — LIPID PANEL
Cholesterol: 144 mg/dL (ref 0–200)
HDL: 45.2 mg/dL (ref >39.00)
LDL Cholesterol: 45 mg/dL (ref 0–99)
NonHDL: 98.55
Total CHOL/HDL Ratio: 3
Triglycerides: 269 mg/dL — ABNORMAL HIGH (ref 0.0–149.0)
VLDL: 53.8 mg/dL — ABNORMAL HIGH (ref 0.0–40.0)

## 2024-03-10 LAB — COMPREHENSIVE METABOLIC PANEL WITH GFR
ALT: 40 U/L (ref 0–53)
AST: 27 U/L (ref 0–37)
Albumin: 4.6 g/dL (ref 3.5–5.2)
Alkaline Phosphatase: 47 U/L (ref 39–117)
BUN: 10 mg/dL (ref 6–23)
CO2: 29 meq/L (ref 19–32)
Calcium: 9.8 mg/dL (ref 8.4–10.5)
Chloride: 103 meq/L (ref 96–112)
Creatinine, Ser: 1.03 mg/dL (ref 0.40–1.50)
GFR: 83.06 mL/min (ref >60.00)
Glucose, Bld: 140 mg/dL — ABNORMAL HIGH (ref 70–99)
Potassium: 4.3 meq/L (ref 3.5–5.1)
Sodium: 141 meq/L (ref 135–145)
Total Bilirubin: 0.4 mg/dL (ref 0.2–1.2)
Total Protein: 7.3 g/dL (ref 6.0–8.3)

## 2024-03-10 LAB — PSA: PSA: 0.46 ng/mL (ref 0.10–4.00)

## 2024-03-11 ENCOUNTER — Ambulatory Visit: Payer: Self-pay | Admitting: Family Medicine

## 2024-06-15 ENCOUNTER — Encounter: Payer: Self-pay | Admitting: Family Medicine

## 2024-06-15 DIAGNOSIS — G4733 Obstructive sleep apnea (adult) (pediatric): Secondary | ICD-10-CM

## 2024-06-15 NOTE — Telephone Encounter (Signed)
 He saw the doctor at [facility] pulmonary care at [facility], most recent visit was 2020. He could go back to that office and they would assign him a different provider --> please order new referral, diagnosis obstructive sleep apnea.  If he prefers a different office then please order referral to [facility]--> they have sleep specialists there as well.

## 2024-06-16 ENCOUNTER — Telehealth: Payer: Self-pay

## 2024-06-16 NOTE — Telephone Encounter (Signed)
 Copied from CRM #[id number]. Topic: General - Other >> Jun 16, 2024 11:45 AM Pinkey ORN wrote: Reason for CRM: Concerns With C-Pap >> Jun 16, 2024 11:46 AM Pinkey ORN wrote: Patient states he has some concerns about his c-pap machine giving out on him, patient wants to know if he needs to schedule to be seen in order to have it replaced. Please contact patient at [phone]

## 2024-06-17 NOTE — Telephone Encounter (Signed)
 Patient previously sent MyChart message inquiring about this. Closing this encounter out, for updates please review MyChart message

## 2024-07-20 ENCOUNTER — Telehealth: Payer: Self-pay | Admitting: *Deleted

## 2024-07-21 ENCOUNTER — Encounter: Payer: Self-pay | Admitting: Neurology

## 2024-07-21 ENCOUNTER — Ambulatory Visit: Admitting: Neurology

## 2024-07-21 VITALS — BP 137/82 | HR 100 | Ht 78.0 in | Wt 316.0 lb

## 2024-07-21 DIAGNOSIS — E669 Obesity, unspecified: Secondary | ICD-10-CM

## 2024-07-21 DIAGNOSIS — G4733 Obstructive sleep apnea (adult) (pediatric): Secondary | ICD-10-CM | POA: Diagnosis not present

## 2024-07-21 DIAGNOSIS — R634 Abnormal weight loss: Secondary | ICD-10-CM | POA: Diagnosis not present

## 2024-07-21 NOTE — Patient Instructions (Signed)
It was nice to meet you today.  As discussed, I will order a home sleep test for re-evaluation of your sleep apnea, as testing was over 5 years ago. The home sleep test is done (HST) to re-establish/confirm the sleep apnea diagnosis and to get your a new machine through the insurance. Our sleep lab staff will reach out to you to arrange for pickup and for tutorial of the test equipment. I will write for a new machine after the HST confirms the OSA (obstructive sleep apnea) diagnosis. Please remember, you will not use your AutoPap machine during the night of testing so we get diagnostic data, not treatment data. We will schedule a follow-up appointment after the new machine set-up date, typically within 31 to 89 days post treatment start.  Per insurance requirement, you will need to show compliance with usage and fulfill a minimum usage percentage.

## 2024-07-21 NOTE — Progress Notes (Signed)
 Duplicate

## 2024-07-21 NOTE — Progress Notes (Signed)
 Subjective:    Patient ID: Nathan Krause is a 53 y.o. male.  HPI    True Mar, MD, PhD [facility] [address] [address] [place], [place] [address]  Dear Dr. Candise,   I saw your patient, Nathan Krause, upon your kind request in my sleep clinic today for initial consultation of his sleep disorder, in particular, evaluation of his prior diagnosis of obstructive sleep apnea.  The patient is unaccompanied today.  As you know, Nathan Krause is a 53 year old male with an underlying medical history of asthma, history of DVT, gout, hypertension, hyperlipidemia, osteoarthritis, history of PE, arthritis, ureterolithiasis, diabetes, and obesity, who was diagnosed with obstructive sleep apnea about 5 years ago and placed on PAP therapy.  He is a sleepiness score is 2 out of 24, fatigue severity score is 22 out of 63. He had a home sleep test through pulmonology in [place].  I was able to review his home sleep test report from 05/18/2019.  Overall AHI was 9.9/h, O2 nadir 76%, average oxygen saturation 92% at the time.  He has been on PAP therapy.  He brought his AutoPap machine.  He has a ResMed AirSense 10 AutoSet machine and his DME provider is [facility].  His compliance for the past 90 days looks excellent.  Average usage of 7 hours and 20 minutes, residual AHI 1.3/h, 95th percentile of pressure at 11.6 cm with a range of 5 to 20 cm without EPR.  He lives at home with his wife.  He has 2 grown children.  They do not currently have a pet in the household.  Bedtime is generally around 11 and rise time around 6:45 AM.  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  He has no nightly nocturia and denies recurrent nocturnal or morning headaches, he is not aware of any family history of sleep apnea.  He drinks caffeine in the form of coffee, about 3/day, usually before noon.  He drinks alcohol in the form of beer, 2-3 times a  week.  He is a non-smoker.  His weight has fluctuated, he is working on weight loss.  Compared to his home sleep test about 5 years ago his weight is about 10 pounds less.    His Past Medical History Is Significant For: Past Medical History:  Diagnosis Date   Anxiety and depression    Asthma    DVT (deep venous thrombosis) (HCC)    in context of surgery   Essential hypertension    Gout    History of blood transfusion    Hyperlipidemia    OSA (obstructive sleep apnea)    on CPAP   Osteoarthritis    Pulmonary embolism (HCC)    in context of surgery   Rheumatoid arthritis (HCC)    rheum not completely sure; possible simply osteoarth.  Was on DMARD at one point time.   Type 2 diabetes mellitus without complication    Ureterolithiasis    L and R    His Past Surgical History Is Significant For: Past Surgical History:  Procedure Laterality Date   EXTRACORPOREAL SHOCK WAVE LITHOTRIPSY Left 06/03/2018   Procedure: EXTRACORPOREAL SHOCK WAVE LITHOTRIPSY (ESWL);  Surgeon: Penne Knee, MD;  Location: [facility] ORS;  Service: Urology;  Laterality: Left;   KNEE ARTHROSCOPY Right    KNEE SURGERY Left     His Family History Is Significant For: Family History  Problem Relation Age of Onset  Colon cancer Mother 72   Breast cancer Mother    Heart disease Father    Heart attack Father 76       deceased   Diabetes Paternal Grandmother    Sleep apnea Neg Hx     His Social History Is Significant For: Social History   Socioeconomic History   Marital status: Married    Spouse name: Not on file   Number of children: Not on file   Years of education: Not on file   Highest education level: Master's degree (e.g., MA, MS, MEng, MEd, MSW, MBA)  Occupational History   Occupation: Disabled  Tobacco Use   Smoking status: Never   Smokeless tobacco: Current    Types: Snuff  Vaping Use   Vaping status: Never Used  Substance and Sexual Activity   Alcohol use: Yes    Alcohol/week: 1.0 standard  drink of alcohol    Types: 1 Cans of beer per week    Comment: social   Drug use: Never   Sexual activity: Yes    Birth control/protection: None  Other Topics Concern   Not on file  Social History Narrative   Married.   2 children.   Educ: BA in information systems and cybersecurity.   Occup: Warden/ranger. Disabled for a while due to knee/mobility issues--returned to work 02/2020.   No tob.   Alc: occ beer.   Enjoys watching movies, working out, spending time with dogs, traveling.   Social Drivers of Corporate investment banker Strain: Low Risk  (03/08/2024)   Overall Financial Resource Strain (CARDIA)    Difficulty of Paying Living Expenses: Not hard at all  Food Insecurity: Food Insecurity Present (03/08/2024)   Hunger Vital Sign    Worried About Running Out of Food in the Last Year: Sometimes true    Ran Out of Food in the Last Year: Never true  Transportation Needs: No Transportation Needs (03/08/2024)   PRAPARE - Administrator, Civil Service (Medical): No    Lack of Transportation (Non-Medical): No  Physical Activity: Sufficiently Active (03/08/2024)   Exercise Vital Sign    Days of Exercise per Week: 4 days    Minutes of Exercise per Session: 70 min  Stress: No Stress Concern Present (03/08/2024)   [employer] of Occupational Health - Occupational Stress Questionnaire    Feeling of Stress : Only a little  Social Connections: Socially Integrated (03/08/2024)   Social Connection and Isolation Panel    Frequency of Communication with Friends and Family: More than three times a week    Frequency of Social Gatherings with Friends and Family: Twice a week    Attends Religious Services: 1 to 4 times per year    Active Member of [employer] or Organizations: Yes    Attends Banker Meetings: 1 to 4 times per year    Marital Status: Married    His Allergies Are:  Allergies  Allergen Reactions   Oxycodone -Acetaminophen  Nausea Only  :   His  Current Medications Are:  Outpatient Encounter Medications as of 07/21/2024  Medication Sig   albuterol  (VENTOLIN  HFA) 108 (90 Base) MCG/ACT inhaler Inhale 2 puffs into the lungs every 4 (four) hours as needed for wheezing or shortness of breath.   aspirin 81 MG tablet Take 81 mg by mouth daily.    glipiZIDE  (GLUCOTROL  XL) 5 MG 24 hr tablet TAKE ONE TABLET BY MOUTH DAILY WITH BREAKFAST FOR DIABETES   Glucosamine 500 MG CAPS Take  1,000 mg by mouth daily.    lisinopril  (ZESTRIL ) 10 MG tablet Take 1 tablet (10 mg total) by mouth daily.   metFORMIN  (GLUCOPHAGE ) 1000 MG tablet Take 1 tablet (1,000 mg total) by mouth 2 (two) times daily with a meal.   Multiple Vitamin (MULTIVITAMIN) capsule Take 1 capsule by mouth daily.    rosuvastatin  (CRESTOR ) 20 MG tablet Take 1 tablet (20 mg total) by mouth daily.   citalopram  (CELEXA ) 10 MG tablet Take 1 tablet (10 mg total) by mouth daily. (Patient not taking: Reported on 03/09/2024)   No facility-administered encounter medications on file as of 07/21/2024.  :   Review of Systems:  Out of a complete 14 point review of systems, all are reviewed and negative with the exception of these symptoms as listed below:  Review of Systems  Neurological:        Pt here for sleep consult Pt states decrease in snoring,few headaches,fatigue,hypertension Pt states sleep study 5 years ago and uses cpap nightly    ESS:2 FSS:22     Objective:  Neurological Exam  Physical Exam Physical Examination:   Vitals:   07/21/24 1410  BP: 137/82  Pulse: 100    General Examination: The patient is a very pleasant 53 y.o. male in no acute distress. He appears well-developed and well-nourished and well groomed.   HEENT: Normocephalic, atraumatic, pupils are equal, round and reactive to light, extraocular tracking is good without limitation to gaze excursion or nystagmus noted. Hearing is grossly intact. Face is symmetric with normal facial animation. Speech is clear with no  dysarthria noted. There is no hypophonia. There is no lip, neck/head, jaw or voice tremor. Neck is supple with full range of passive and active motion. There are no carotid bruits on auscultation. Oropharynx exam reveals: mild mouth dryness, adequate dental hygiene and moderate airway crowding, due to small airway entry, thicker soft palate, larger uvula, Mallampati class III, neck circumference 18-1/2 inches, tonsillar size of about 1+ bilaterally.  Tongue protrudes centrally and palate elevates symmetrically.    Chest: Clear to auscultation without wheezing, rhonchi or crackles noted.  Heart: S1+S2+0, regular and normal without murmurs, rubs or gallops noted.   Abdomen: Soft, non-tender and non-distended.  Extremities: There is no pitting edema in the distal lower extremities bilaterally.   Skin: Warm and dry without trophic changes noted.   Musculoskeletal: exam reveals no obvious joint deformities.   Neurologically:  Mental status: The patient is awake, alert and oriented in all 4 spheres. His immediate and remote memory, attention, language skills and fund of knowledge are appropriate. There is no evidence of aphasia, agnosia, apraxia or anomia. Speech is clear with normal prosody and enunciation. Thought process is linear. Mood is normal and affect is normal.  Cranial nerves II - XII are as described above under HEENT exam.  Motor exam: Normal bulk, moving all 4 extremities without restriction, no obvious action or resting tremor.  Fine motor skills and coordination: grossly intact.  Cerebellar testing: No dysmetria or intention tremor. There is no truncal or gait ataxia.  Sensory exam: intact to light touch in the upper and lower extremities.  Gait, station and balance: He stands easily. No veering to one side is noted. No leaning to one side is noted. Posture is age-appropriate and stance is narrow based. Gait shows normal stride length and normal pace. No problems turning are noted.    Assessment and Plan:   In summary, WENDELIN BRADT is a very pleasant 53 y.o.-year  old male with an underlying medical history of asthma, history of DVT, gout, hypertension, hyperlipidemia, osteoarthritis, history of PE, arthritis, ureterolithiasis, diabetes, and obesity, who presents for evaluation of his obstructive sleep apnea which was diagnosed a little over 5 years ago.  He has benefited from treatment.  He is compliant with his AutoPap which is currently on practically the factory setting with a range of 5 to 20 cm.  He is interested in getting a new machine.  He has noticed an increase in his leak from the mask although he is up to date usually with his supplies through [facility].  His weight has fluctuated a little bit.  He is working on weight loss.  He is commended for his treatment adherence.  We will proceed with a home sleep test for reevaluation.  He is reminded not to use his AutoPap the night of treatment.  He would also be interested in getting a travel machine down the road.  We will plan to follow-up after testing and hopefully I can prescribe a new machine after his home sleep test is completed.  We talked briefly about alternative treatment options for obstructive sleep apnea but mutually agreed to continue to pursue PAP therapy for him.  We talked about the importance of maintaining a healthy lifestyle, good sleep hygiene, and pursuing healthy weight loss.  I answered all his questions today and he was in agreement with our plan. Thank you very much for allowing me to participate in the care of this nice patient. If I can be of any further assistance to you please do not hesitate to call me at [phone].  Sincerely,   True Mar, MD, PhD

## 2024-09-27 NOTE — Telephone Encounter (Signed)
Appt scheduled. No further action needed at this time.

## 2024-10-03 ENCOUNTER — Ambulatory Visit: Admitting: Family Medicine

## 2024-10-03 ENCOUNTER — Encounter: Payer: Self-pay | Admitting: Family Medicine

## 2024-10-03 VITALS — BP 126/84 | HR 94 | Temp 98.3°F | Ht 78.0 in | Wt 316.2 lb

## 2024-10-03 DIAGNOSIS — E119 Type 2 diabetes mellitus without complications: Secondary | ICD-10-CM

## 2024-10-03 DIAGNOSIS — I10 Essential (primary) hypertension: Secondary | ICD-10-CM

## 2024-10-03 DIAGNOSIS — E7800 Pure hypercholesterolemia, unspecified: Secondary | ICD-10-CM | POA: Diagnosis not present

## 2024-10-03 DIAGNOSIS — Z7984 Long term (current) use of oral hypoglycemic drugs: Secondary | ICD-10-CM | POA: Diagnosis not present

## 2024-10-03 DIAGNOSIS — Z1211 Encounter for screening for malignant neoplasm of colon: Secondary | ICD-10-CM

## 2024-10-03 LAB — COMPREHENSIVE METABOLIC PANEL WITH GFR
ALT: 32 U/L (ref 0–53)
AST: 17 U/L (ref 0–37)
Albumin: 4.3 g/dL (ref 3.5–5.2)
Alkaline Phosphatase: 46 U/L (ref 39–117)
BUN: 11 mg/dL (ref 6–23)
CO2: 27 meq/L (ref 19–32)
Calcium: 9.3 mg/dL (ref 8.4–10.5)
Chloride: 104 meq/L (ref 96–112)
Creatinine, Ser: 1.13 mg/dL (ref 0.40–1.50)
GFR: 74.02 mL/min (ref >60.00)
Glucose, Bld: 139 mg/dL — ABNORMAL HIGH (ref 70–99)
Potassium: 4.4 meq/L (ref 3.5–5.1)
Sodium: 140 meq/L (ref 135–145)
Total Bilirubin: 0.4 mg/dL (ref 0.2–1.2)
Total Protein: 7 g/dL (ref 6.0–8.3)

## 2024-10-03 LAB — LIPID PANEL
Cholesterol: 121 mg/dL (ref 0–200)
HDL: 39.1 mg/dL (ref >39.00)
LDL Cholesterol: 51 mg/dL (ref 0–99)
NonHDL: 82.29
Total CHOL/HDL Ratio: 3
Triglycerides: 155 mg/dL — ABNORMAL HIGH (ref 0.0–149.0)
VLDL: 31 mg/dL (ref 0.0–40.0)

## 2024-10-03 LAB — POCT GLYCOSYLATED HEMOGLOBIN (HGB A1C)
HbA1c POC (<> result, manual entry): 7.1 % (ref 4.0–5.6)
HbA1c, POC (controlled diabetic range): 7.1 % — AB (ref 0.0–7.0)
HbA1c, POC (prediabetic range): 7.1 % — AB (ref 5.7–6.4)
Hemoglobin A1C: 7.1 % — AB (ref 4.0–5.6)

## 2024-10-03 LAB — MICROALBUMIN / CREATININE URINE RATIO
Creatinine,U: 158.6 mg/dL
Microalb Creat Ratio: 23.5 mg/g (ref 0.0–30.0)
Microalb, Ur: 3.7 mg/dL — ABNORMAL HIGH (ref 0.0–1.9)

## 2024-10-03 NOTE — Progress Notes (Signed)
 OFFICE VISIT  10/03/2024  CC:  Chief Complaint  Patient presents with   Medical Management of Chronic Issues    Pt is fasting    Patient is a 53 y.o. male who presents for 42-month follow-up hypertension, diabetes, and hypercholesterolemia. A/P as of last visit: 1 diabetes without complication. POC Hba1c is 7.0% today. Continue Glucotrol  XL 5 mg a day and metformin  1000 mg twice a day. Monitor serum creatinine/GFR today.   #2 hypercholesterolemia, doing well long-term on rosuvastatin  20 mg a day. Lipid panel and hepatic panel today (he last ate about 4 hours ago).   #3 hypertension, controlled on lisinopril  10 mg a day. Electrolytes and creatinine monitoring today.   #4 right shoulder pain. He had onset of pain a couple of months ago without preceding incident or trauma. He went to [facility] and reports that his x-ray showed what sounds like a osteophyte per his description today and they did a steroid injection.  He was possibly going to get an MRI if not significantly better upon follow-up.  He says he did get some significant relief from the steroid but still has problems raising the shoulder and reaching out. He will be following up with Ortho soon.  INTERIM HX: Alyus feels good although he has been working many very long days at work lately. He had started good exercise and diet habits a couple of months ago but this was interrupted by all the increased work he had to do.  No home glucose or blood pressure monitoring to report today.  No acute concerns.    Past Medical History:  Diagnosis Date   Allergy    Anxiety and depression    Asthma    Diverticulosis    DVT (deep venous thrombosis) (HCC)    in context of surgery   Essential hypertension    Gout    Hepatic steatosis    History of blood transfusion    Hyperlipidemia    OSA (obstructive sleep apnea)    on CPAP   Osteoarthritis    Pulmonary embolism (HCC)    in context of surgery   Rheumatoid  arthritis (HCC)    rheum not completely sure; possible simply osteoarth.  Was on DMARD at one point time.   Type 2 diabetes mellitus without complication    Ureterolithiasis    L and R    Past Surgical History:  Procedure Laterality Date   EXTRACORPOREAL SHOCK WAVE LITHOTRIPSY Left 06/03/2018   Procedure: EXTRACORPOREAL SHOCK WAVE LITHOTRIPSY (ESWL);  Surgeon: Penne Knee, MD;  Location: [facility] ORS;  Service: Urology;  Laterality: Left;   KNEE ARTHROSCOPY Right    KNEE SURGERY Left     Outpatient Medications Prior to Visit  Medication Sig Dispense Refill   albuterol  (VENTOLIN  HFA) 108 (90 Base) MCG/ACT inhaler Inhale 2 puffs into the lungs every 4 (four) hours as needed for wheezing or shortness of breath. 18 g 0   aspirin 81 MG tablet Take 81 mg by mouth daily.      glipiZIDE  (GLUCOTROL  XL) 5 MG 24 hr tablet TAKE ONE TABLET BY MOUTH DAILY WITH BREAKFAST FOR DIABETES 90 tablet 3   Glucosamine 500 MG CAPS Take 1,000 mg by mouth daily.      lisinopril  (ZESTRIL ) 10 MG tablet Take 1 tablet (10 mg total) by mouth daily. 90 tablet 3   metFORMIN  (GLUCOPHAGE ) 1000 MG tablet Take 1 tablet (1,000 mg total) by mouth 2 (two) times daily with a meal. 180 tablet 3   Multiple  Vitamin (MULTIVITAMIN) capsule Take 1 capsule by mouth daily.      rosuvastatin  (CRESTOR ) 20 MG tablet Take 1 tablet (20 mg total) by mouth daily. 90 tablet 3   citalopram  (CELEXA ) 10 MG tablet Take 1 tablet (10 mg total) by mouth daily. (Patient not taking: Reported on 03/09/2024) 30 tablet 0   No facility-administered medications prior to visit.    Allergies  Allergen Reactions   Oxycodone -Acetaminophen  Nausea Only    Review of Systems As per HPI  PE:    10/03/2024    8:29 AM 07/21/2024    2:10 PM 03/09/2024    3:52 PM  Vitals with BMI  Height 6' 6 6' 6 6' 6  Weight 316 lbs 3 oz 316 lbs 313 lbs  BMI 36.55 36.52 36.18  Systolic 126 137 874  Diastolic 84 82 85  Pulse 94 100 98     Physical Exam  Gen:  Alert, well appearing.  Patient is oriented to person, place, time, and situation. AFFECT: pleasant, lucid thought and speech. CV: RRR, no m/r/g.   LUNGS: CTA bilat, nonlabored resps, good aeration in all lung fields. Extremities: No edema  LABS:  Last CBC Lab Results  Component Value Date   WBC 6.8 01/05/2023   HGB 15.2 01/05/2023   HCT 45.4 01/05/2023   MCV 91.2 01/05/2023   MCH 30.3 11/29/2021   RDW 13.4 01/05/2023   PLT 258.0 01/05/2023   Last metabolic panel Lab Results  Component Value Date   GLUCOSE 140 (H) 03/09/2024   NA 141 03/09/2024   K 4.3 03/09/2024   CL 103 03/09/2024   CO2 29 03/09/2024   BUN 10 03/09/2024   CREATININE 1.03 03/09/2024   GFR 83.06 03/09/2024   CALCIUM  9.8 03/09/2024   PROT 7.3 03/09/2024   ALBUMIN 4.6 03/09/2024   BILITOT 0.4 03/09/2024   ALKPHOS 47 03/09/2024   AST 27 03/09/2024   ALT 40 03/09/2024   ANIONGAP 13 12/18/2019   Last lipids Lab Results  Component Value Date   CHOL 144 03/09/2024   HDL 45.20 03/09/2024   LDLCALC 45 03/09/2024   LDLDIRECT 110.0 01/05/2023   TRIG 269.0 (H) 03/09/2024   CHOLHDL 3 03/09/2024   Last hemoglobin A1c Lab Results  Component Value Date   HGBA1C 7.1 (A) 10/03/2024   HGBA1C 7.1 10/03/2024   HGBA1C 7.1 (A) 10/03/2024   HGBA1C 7.1 (A) 10/03/2024   IMPRESSION AND PLAN:  1 diabetes without complication. POC Hba1c is 7.1% today.  He prefers to try to get back on a better diet and exercise regimen.  Wants to hold off any changes in medication at this time. Continue Glucotrol  XL 5 mg a day and metformin  1000 mg twice a day. Monitor serum creatinine/GFR today. Urine microalbumin/creatinine today.  #2 hypercholesterolemia, doing well long-term on rosuvastatin  20 mg a day. Lipid panel and hepatic panel today.   #3 hypertension, controlled on lisinopril  10 mg a day. Electrolytes and creatinine monitoring today.  An After Visit Summary was printed and given to the patient.  FOLLOW UP:  Return in about 3 months (around 01/01/2025) for routine chronic illness f/u. Next CPE May 2026 Signed:  Gerlene Hockey, MD           10/03/2024

## 2024-10-04 ENCOUNTER — Ambulatory Visit: Payer: Self-pay | Admitting: Family Medicine

## 2024-10-04 MED ORDER — GLIPIZIDE ER 10 MG PO TB24: 10.0000 mg | ORAL_TABLET | Freq: Every day | ORAL | 3 refills | Status: AC

## 2024-10-04 NOTE — Telephone Encounter (Signed)
 No further action needed MyChart message read

## 2024-10-04 NOTE — Telephone Encounter (Signed)
 Prescription sent

## 2025-01-02 ENCOUNTER — Ambulatory Visit: Admitting: Family Medicine
